# Patient Record
Sex: Female | Born: 1965
Health system: Southern US, Community
[De-identification: ages and names within clinical notes are randomized; demographics above are authoritative.]

## PROBLEM LIST (undated history)

## (undated) DIAGNOSIS — E039 Hypothyroidism, unspecified: Secondary | ICD-10-CM

## (undated) DIAGNOSIS — Z87442 Personal history of urinary calculi: Secondary | ICD-10-CM

## (undated) DIAGNOSIS — J449 Chronic obstructive pulmonary disease, unspecified: Secondary | ICD-10-CM

## (undated) DIAGNOSIS — F419 Anxiety disorder, unspecified: Secondary | ICD-10-CM

## (undated) DIAGNOSIS — M419 Scoliosis, unspecified: Secondary | ICD-10-CM

## (undated) DIAGNOSIS — R7303 Prediabetes: Secondary | ICD-10-CM

---

## 2013-07-09 ENCOUNTER — Ambulatory Visit: Payer: Self-pay

## 2013-07-09 ENCOUNTER — Ambulatory Visit (INDEPENDENT_AMBULATORY_CARE_PROVIDER_SITE_OTHER): Payer: BC Managed Care – PPO | Admitting: Physician Assistant

## 2013-07-09 VITALS — BP 112/74 | HR 73 | Temp 97.9°F | Resp 18 | Ht 67.25 in | Wt 159.0 lb

## 2013-07-09 DIAGNOSIS — R21 Rash and other nonspecific skin eruption: Secondary | ICD-10-CM

## 2013-07-09 DIAGNOSIS — B370 Candidal stomatitis: Secondary | ICD-10-CM

## 2013-07-09 MED ORDER — NYSTATIN 100000 UNIT/ML MT SUSP: 500000.0000 [IU] | Freq: Four times a day (QID) | OROMUCOSAL | Status: DC

## 2013-07-09 NOTE — Progress Notes (Signed)
Patient ID: Brenda Johnston MRN: [id number], DOB: Dec 13, 1966, 47 y.o. Date of Encounter: 07/09/2013, 8:14 PM  Primary Physician: No PCP Per Patient  Chief Complaint: "I have thrush."   HPI: 47 y.o. female with history below presents with a 1 week history of a painful white rash along the surface along her tongue. Also causing some soreness along the right side of her neck. Afebrile. No chills. States, "I have thrush." States that she had thrush one year ago as well. No formal work up done as to etiology. No recent antibiotics. No inhaled steroids. States incident was due to increased stress and so is this one. Never been checked for HIV. Married for 27 years. Husband here with her today in the room.   Stays thirsty. Drinks about a gallon of sweet tea per day. Gets up to urinate twice per night, this is baseline for her.    History reviewed. No pertinent past medical history.   Home Meds: Prior to Admission medications   Not on File    Allergies:  Allergies  Allergen Reactions  . Codeine   . Penicillins     History   Social History  . Marital Status: Married    Spouse Name: N/A    Number of Children: N/A  . Years of Education: N/A   Occupational History  . Not on file.   Social History Main Topics  . Smoking status: Current Every Day Smoker  . Smokeless tobacco: Not on file  . Alcohol Use: No  . Drug Use: No  . Sexually Active: No   Other Topics Concern  . Not on file   Social History Narrative  . No narrative on file     Review of Systems: Constitutional: negative for chills, fever, or fatigue  HEENT: positive for ST. negative for vision changes, hearing loss, congestion, or rhinorrhea Cardiovascular: negative for chest pain or palpitations Respiratory: negative for wheezing, shortness of breath, or cough Dermatological: see above   Physical Exam: Blood pressure 112/74, pulse 73, temperature 97.9 F (36.6 C), temperature source Oral, resp. rate 18, height 5'  7.25" (1.708 m), weight 159 lb (72.122 kg), SpO2 98.00%., Body mass index is 24.72 kg/(m^2). General: Well developed, well nourished, in no acute distress. Head: Normocephalic, atraumatic, eyes without discharge, sclera non-icteric, nares are without discharge. Bilateral auditory canals clear, TM's are without perforation, pearly grey and translucent with reflective cone of light bilaterally. Oral cavity moist, posterior pharynx without exudate, erythema, peritonsillar abscess, or post nasal drip. Surface of the tongue with white lesions on an erythematous base.   Neck: Supple. No thyromegaly. Full ROM. No lymphadenopathy. Lungs: Clear bilaterally to auscultation without wheezes, rales, or rhonchi. Breathing is unlabored. Heart: RRR with S1 S2. No murmurs, rubs, or gallops appreciated. Msk:  Strength and tone normal for age. Extremities/Skin: Warm and dry. No clubbing or cyanosis. No edema. No rashes or suspicious lesions. Neuro: Alert and oriented X 3. Moves all extremities spontaneously. Gait is normal. CNII-XII grossly in tact. Psych:  Responds to questions appropriately with a normal affect.   Labs: Results for orders placed in visit on 07/09/13  POCT SKIN KOH      Result Value Range   Skin KOH, POC Positive    GLUCOSE, POCT (MANUAL RESULT ENTRY)      Result Value Range   POC Glucose 105 (*) 70 - 99 mg/dl     Patient and patient's husband are very clear that they decline HIV testing. I have advised them both  that I recommend this, however they again state they do not want HIV testing.   ASSESSMENT AND PLAN:  47 y.o. female with thrush.  -Nystatin 100,000/mL 1 tsp po qid #240 RF 1 -Declines HIV evaluation, I advised to her the importance of this -RTC prn  Signed, Eula Listen, PA-C 07/09/2013 8:14 PM

## 2013-07-11 ENCOUNTER — Telehealth: Payer: Self-pay

## 2013-07-11 DIAGNOSIS — B370 Candidal stomatitis: Secondary | ICD-10-CM

## 2013-07-11 MED ORDER — FLUCONAZOLE 100 MG PO TABS: ORAL_TABLET | ORAL | Status: DC

## 2013-07-11 NOTE — Telephone Encounter (Signed)
Changed. New medication sent in. Stop the Nystatin.

## 2013-07-11 NOTE — Telephone Encounter (Signed)
Patient would like medicine prescribed Monday switched from a liquid to a pill form. Please call when ready.

## 2013-07-11 NOTE — Telephone Encounter (Signed)
Pt advised.

## 2013-10-19 ENCOUNTER — Other Ambulatory Visit: Payer: Self-pay | Admitting: Physician Assistant

## 2015-12-17 ENCOUNTER — Other Ambulatory Visit: Payer: Self-pay | Admitting: Nurse Practitioner

## 2015-12-17 ENCOUNTER — Ambulatory Visit
Admission: RE | Admit: 2015-12-17 | Discharge: 2015-12-17 | Disposition: A | Discharge status: Home / Self Care | Payer: BLUE CROSS/BLUE SHIELD | Source: Ambulatory Visit | Attending: Nurse Practitioner | Admitting: Nurse Practitioner

## 2015-12-17 DIAGNOSIS — R062 Wheezing: Secondary | ICD-10-CM

## 2015-12-17 DIAGNOSIS — R05 Cough: Secondary | ICD-10-CM

## 2015-12-17 DIAGNOSIS — R059 Cough, unspecified: Secondary | ICD-10-CM

## 2015-12-17 DIAGNOSIS — F17200 Nicotine dependence, unspecified, uncomplicated: Secondary | ICD-10-CM

## 2016-12-31 DIAGNOSIS — J102 Influenza due to other identified influenza virus with gastrointestinal manifestations: Secondary | ICD-10-CM | POA: Diagnosis not present

## 2017-01-10 DIAGNOSIS — J209 Acute bronchitis, unspecified: Secondary | ICD-10-CM | POA: Diagnosis not present

## 2017-03-13 DIAGNOSIS — J069 Acute upper respiratory infection, unspecified: Secondary | ICD-10-CM | POA: Diagnosis not present

## 2017-04-05 DIAGNOSIS — B370 Candidal stomatitis: Secondary | ICD-10-CM | POA: Diagnosis not present

## 2018-03-15 DIAGNOSIS — H01002 Unspecified blepharitis right lower eyelid: Secondary | ICD-10-CM | POA: Diagnosis not present

## 2018-03-15 DIAGNOSIS — H01001 Unspecified blepharitis right upper eyelid: Secondary | ICD-10-CM | POA: Diagnosis not present

## 2018-03-15 DIAGNOSIS — H01004 Unspecified blepharitis left upper eyelid: Secondary | ICD-10-CM | POA: Diagnosis not present

## 2018-03-15 DIAGNOSIS — H04123 Dry eye syndrome of bilateral lacrimal glands: Secondary | ICD-10-CM | POA: Diagnosis not present

## 2018-03-24 ENCOUNTER — Encounter: Payer: Self-pay | Admitting: Family Medicine

## 2018-03-24 ENCOUNTER — Other Ambulatory Visit: Payer: Self-pay

## 2018-03-24 ENCOUNTER — Ambulatory Visit (INDEPENDENT_AMBULATORY_CARE_PROVIDER_SITE_OTHER): Payer: BLUE CROSS/BLUE SHIELD | Admitting: Family Medicine

## 2018-03-24 VITALS — BP 112/84 | HR 96 | Temp 99.0°F | Ht 67.72 in | Wt 183.0 lb

## 2018-03-24 DIAGNOSIS — R631 Polydipsia: Secondary | ICD-10-CM | POA: Diagnosis not present

## 2018-03-24 DIAGNOSIS — R5383 Other fatigue: Secondary | ICD-10-CM | POA: Diagnosis not present

## 2018-03-24 DIAGNOSIS — R3121 Asymptomatic microscopic hematuria: Secondary | ICD-10-CM | POA: Diagnosis not present

## 2018-03-24 DIAGNOSIS — F418 Other specified anxiety disorders: Secondary | ICD-10-CM | POA: Diagnosis not present

## 2018-03-24 LAB — POCT URINALYSIS DIP (MANUAL ENTRY)
Bilirubin, UA: NEGATIVE
Glucose, UA: NEGATIVE mg/dL
Ketones, POC UA: NEGATIVE mg/dL
Leukocytes, UA: NEGATIVE
Nitrite, UA: NEGATIVE
Protein Ur, POC: NEGATIVE mg/dL
Spec Grav, UA: 1.025 (ref 1.010–1.025)
Urobilinogen, UA: 0.2 E.U./dL
pH, UA: 5.5 (ref 5.0–8.0)

## 2018-03-24 MED ORDER — SERTRALINE HCL 50 MG PO TABS: 50.0000 mg | ORAL_TABLET | Freq: Every day | ORAL | 3 refills | Status: DC

## 2018-03-24 NOTE — Progress Notes (Signed)
3/29/20192:13 PM  Brenda Johnston [birth year], 52 y.o. female [id number]  Chief Complaint  Patient presents with  . Establish Care    Wantint to have lab work done. Says she is thirsty all the time, fatigue and has been feeling aanxiety and depressiom    HPI:   Patient is a 52 y.o. female who presents today to establish care and has several concerns  She reports months of not feeling well, hard to describe, but feels that she is getting worse She is very tired, has been gaining weight, thirsty all the time. She reports nausea.  She is worried she is having either thyroid issues or diabetes as they both run in her family She is also struggling with depression and anxiety It started with the death of her mother 2 years ago. 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 She denies SI. She has no previous issues with depression. She is requesting to start medication.  She denies any known chronic medical conditions Patient is has been postmenopausal for about 4 years She has not had a PCP in a very long time  Depression screen PHQ 2/9 03/24/2018  Decreased Interest 3  Down, Depressed, Hopeless 3  PHQ - 2 Score 6  Altered sleeping 3  Tired, decreased energy 3  Change in appetite 2  Feeling bad or failure about yourself  2  Trouble concentrating 3  Moving slowly or fidgety/restless 3  Suicidal thoughts 0  PHQ-9 Score 22   GAD 7 : Generalized Anxiety Score 03/24/2018  Nervous, Anxious, on Edge 3  Control/stop worrying 3  Worry too much - different things 3  Trouble relaxing 3  Restless 3  Easily annoyed or irritable 3  Afraid - awful might happen 3  Total GAD 7 Score 21  Anxiety Difficulty Extremely difficult     Allergies  Allergen Reactions  . Codeine   . Penicillins     Prior to Admission medications   Medication Sig Start Date End Date Taking? Authorizing  Provider    History reviewed. No pertinent past medical history.  History reviewed. No pertinent surgical history.  Social History   Tobacco Use  . Smoking status: Current Every Day Smoker  . Smokeless tobacco: Never Used  Substance Use Topics  . Alcohol use: No    Family History  Problem Relation Age of Onset  . Cancer Mother   . Stroke Father   . Diabetes Sister   . Hypertension Brother     Review of Systems  Constitutional: Positive for chills and malaise/fatigue. Negative for diaphoresis, fever and weight loss.  HENT: Negative for congestion, ear pain and sore throat.   Eyes: Negative for blurred vision and double vision.  Respiratory: Negative for cough and shortness of breath.   Cardiovascular: Positive for palpitations. Negative for chest pain and leg swelling.  Gastrointestinal: Positive for diarrhea and nausea. Negative for abdominal pain, blood in stool, constipation, heartburn, melena and vomiting.  Genitourinary: Positive for frequency. Negative for dysuria, hematuria and urgency.  Musculoskeletal: Negative for myalgias.  Neurological: Positive for dizziness. Negative for sensory change, speech change, focal weakness and headaches.  Endo/Heme/Allergies: Positive for polydipsia.  Psychiatric/Behavioral: Positive for depression. Negative for substance abuse and suicidal ideas. The patient is nervous/anxious. The patient does not have insomnia.      OBJECTIVE:  Blood pressure 112/84, pulse 96, temperature 99 F (37.2 C),  temperature source Oral, height 5' 7.72" (1.72 m), weight 183 lb (83 kg), SpO2 100 %.   Physical Exam  Constitutional: She is oriented to person, place, and time and well-developed, well-nourished, and in no distress.  HENT:  Head: Normocephalic and atraumatic.  Right Ear: Hearing, tympanic membrane, external ear and ear canal normal.  Left Ear: Hearing, tympanic membrane, external ear and ear canal normal.  Mouth/Throat: Oropharynx is  clear and moist.  Eyes: Pupils are equal, round, and reactive to light. EOM are normal.  Neck: Neck supple. No thyromegaly present.  Cardiovascular: Normal rate, regular rhythm, normal heart sounds and intact distal pulses. Exam reveals no gallop and no friction rub.  No murmur heard. Pulmonary/Chest: Effort normal and breath sounds normal. She has no wheezes. She has no rales.  Abdominal: Soft. Bowel sounds are normal. She exhibits no distension and no mass. There is no tenderness.  Musculoskeletal: Normal range of motion. She exhibits no edema.  Lymphadenopathy:    She has no cervical adenopathy.  Neurological: She is alert and oriented to person, place, and time. She has normal reflexes. Gait normal.  Skin: Skin is warm and dry.  Psychiatric: Her affect is labile. She exhibits a depressed mood.  Nursing note and vitals reviewed.   Results for orders placed or performed in visit on 03/24/18 (from the past 24 hour(s))  POCT urinalysis dipstick     Status: Abnormal   Collection Time: 03/24/18  2:24 PM  Result Value Ref Range   Color, UA yellow yellow   Clarity, UA clear clear   Glucose, UA negative negative mg/dL   Bilirubin, UA negative negative   Ketones, POC UA negative negative mg/dL   Spec Grav, UA 1.6101.025 9.6041.010 - 1.025   Blood, UA moderate (A) negative   pH, UA 5.5 5.0 - 8.0   Protein Ur, POC negative negative mg/dL   Urobilinogen, UA 0.2 0.2 or 1.0 E.U./dL   Nitrite, UA Negative Negative   Leukocytes, UA Negative Negative    ASSESSMENT and PLAN  1. Anxiety with depression Patient with severe depression and anxiety that seems to have been triggered by grief. Discussed treatment options, discussed new med r/se/b. Discussed titration. Provided resources for grief counseling. RTC precautions given.  2. Fatigue, unspecified type Might be related to depression and anxiety, however will rule out organic etiologies.  - CBC with Differential/Platelet - TSH - Comprehensive  metabolic panel - POCT urinalysis dipstick - Hemoglobin A1c  3. Polydipsia - Comprehensive metabolic panel - POCT urinalysis dipstick - Hemoglobin A1c  4. Asymptomatic microscopic hematuria - Urine Microscopic  Other orders - sertraline (ZOLOFT) 50 MG tablet; Take 1 tablet (50 mg total) by mouth daily.  Return in about 3 weeks (around 04/14/2018).    Myles LippsIrma M Santiago, MD Primary [facility] at [facility] [address] [place], [place] [address] Ph.  [phone] Fax [phone]

## 2018-03-24 NOTE — Patient Instructions (Addendum)
1. Start sertraline (zolft) with 1/2 tablet once a day for a week. Then increase to 1 tablet daily. Morning is preferred. However if it makes you sleepy it is okay to take at bedtime.  2. I encourage you to consider grief counseling. Here are some resources:   [facility], [phone]  [facility], [phone]  [facility], [phone]    IF you received an x-ray today, you will receive an invoice from [facility]. Please contact [facility] at [phone] with questions or concerns regarding your invoice.   IF you received labwork today, you will receive an invoice from [facility]. Please contact [facility] at [phone] with questions or concerns regarding your invoice.   Our billing staff will not be able to assist you with questions regarding bills from these companies.  You will be contacted with the lab results as soon as they are available. The fastest way to get your results is to activate your My Chart account. Instructions are located on the last page of this paperwork. If you have not heard from us regarding the results in 2 weeks, please contact this office.

## 2018-03-25 LAB — URINALYSIS, MICROSCOPIC ONLY
Bacteria, UA: NONE SEEN
Casts: NONE SEEN /lpf

## 2018-03-25 LAB — CBC WITH DIFFERENTIAL/PLATELET
Basophils Absolute: 0.1 10*3/uL (ref 0.0–0.2)
Basos: 1 %
EOS (ABSOLUTE): 0.2 10*3/uL (ref 0.0–0.4)
Eos: 2 %
Hematocrit: 40.9 % (ref 34.0–46.6)
Hemoglobin: 14 g/dL (ref 11.1–15.9)
Immature Grans (Abs): 0 10*3/uL (ref 0.0–0.1)
Immature Granulocytes: 0 %
Lymphocytes Absolute: 2.9 10*3/uL (ref 0.7–3.1)
Lymphs: 34 %
MCH: 31.8 pg (ref 26.6–33.0)
MCHC: 34.2 g/dL (ref 31.5–35.7)
MCV: 93 fL (ref 79–97)
Monocytes Absolute: 0.3 10*3/uL (ref 0.1–0.9)
Monocytes: 4 %
Neutrophils Absolute: 5.1 10*3/uL (ref 1.4–7.0)
Neutrophils: 59 %
Platelets: 265 10*3/uL (ref 150–379)
RBC: 4.4 x10E6/uL (ref 3.77–5.28)
RDW: 15.4 % (ref 12.3–15.4)
WBC: 8.5 10*3/uL (ref 3.4–10.8)

## 2018-03-25 LAB — COMPREHENSIVE METABOLIC PANEL
ALT: 48 IU/L — ABNORMAL HIGH (ref 0–32)
AST: 33 IU/L (ref 0–40)
Albumin/Globulin Ratio: 1.8 (ref 1.2–2.2)
Albumin: 4.9 g/dL (ref 3.5–5.5)
Alkaline Phosphatase: 82 IU/L (ref 39–117)
BUN/Creatinine Ratio: 10 (ref 9–23)
BUN: 13 mg/dL (ref 6–24)
Bilirubin Total: 0.5 mg/dL (ref 0.0–1.2)
CO2: 22 mmol/L (ref 20–29)
Calcium: 10.1 mg/dL (ref 8.7–10.2)
Chloride: 100 mmol/L (ref 96–106)
Creatinine, Ser: 1.28 mg/dL — ABNORMAL HIGH (ref 0.57–1.00)
GFR calc Af Amer: 56 mL/min/{1.73_m2} — ABNORMAL LOW (ref >59)
GFR calc non Af Amer: 49 mL/min/{1.73_m2} — ABNORMAL LOW (ref >59)
Globulin, Total: 2.8 g/dL (ref 1.5–4.5)
Glucose: 147 mg/dL — ABNORMAL HIGH (ref 65–99)
Potassium: 3.7 mmol/L (ref 3.5–5.2)
Sodium: 141 mmol/L (ref 134–144)
Total Protein: 7.7 g/dL (ref 6.0–8.5)

## 2018-03-25 LAB — TSH: TSH: 94.86 u[IU]/mL — ABNORMAL HIGH (ref 0.450–4.500)

## 2018-03-25 LAB — HEMOGLOBIN A1C
Est. average glucose Bld gHb Est-mCnc: 120 mg/dL
Hgb A1c MFr Bld: 5.8 % — ABNORMAL HIGH (ref 4.8–5.6)

## 2018-03-28 ENCOUNTER — Other Ambulatory Visit: Payer: Self-pay | Admitting: Family Medicine

## 2018-03-28 MED ORDER — LEVOTHYROXINE SODIUM 50 MCG PO TABS: 50.0000 ug | ORAL_TABLET | Freq: Every day | ORAL | 3 refills | Status: DC

## 2018-03-29 ENCOUNTER — Telehealth: Payer: Self-pay | Admitting: *Deleted

## 2018-03-29 NOTE — Telephone Encounter (Signed)
Patient states that she stopped taking Zoloft it gave her sweats,headache stomach pain.  She wanted to know if there is something she can take as needed rather than everyday

## 2018-04-05 NOTE — Telephone Encounter (Signed)
Lets hold off on any other depression medication as her symptoms might be related to her hypothyroidism. thanks

## 2018-04-06 NOTE — Telephone Encounter (Signed)
Left detailed message per HIPPA

## 2018-04-14 ENCOUNTER — Other Ambulatory Visit: Payer: Self-pay

## 2018-04-14 ENCOUNTER — Ambulatory Visit (INDEPENDENT_AMBULATORY_CARE_PROVIDER_SITE_OTHER): Payer: BLUE CROSS/BLUE SHIELD | Admitting: Family Medicine

## 2018-04-14 ENCOUNTER — Encounter: Payer: Self-pay | Admitting: Family Medicine

## 2018-04-14 VITALS — BP 122/64 | HR 100 | Temp 98.1°F | Ht 67.5 in | Wt 180.6 lb

## 2018-04-14 DIAGNOSIS — R7303 Prediabetes: Secondary | ICD-10-CM | POA: Diagnosis not present

## 2018-04-14 DIAGNOSIS — E039 Hypothyroidism, unspecified: Secondary | ICD-10-CM

## 2018-04-14 DIAGNOSIS — R3121 Asymptomatic microscopic hematuria: Secondary | ICD-10-CM | POA: Diagnosis not present

## 2018-04-14 DIAGNOSIS — N289 Disorder of kidney and ureter, unspecified: Secondary | ICD-10-CM | POA: Diagnosis not present

## 2018-04-14 DIAGNOSIS — F418 Other specified anxiety disorders: Secondary | ICD-10-CM | POA: Diagnosis not present

## 2018-04-14 MED ORDER — CITALOPRAM HYDROBROMIDE 10 MG PO TABS: 10.0000 mg | ORAL_TABLET | Freq: Every day | ORAL | 1 refills | Status: DC

## 2018-04-14 NOTE — Progress Notes (Signed)
4/19/20191:40 PM  Brenda Johnston Oct 29, 1966, 52 y.o. female [id number]  Chief Complaint  Patient presents with  . Follow-up    stopped taking the meds as instructed, still having anxiety with panic attacks. Having feet cramps as well  . Dry Eye    right eye has felt dry for 3 wks, went to eye doctor, he says it may be due to thyroid condition. Problem only occur at night    HPI:   Patient is a 52 y.o. female with past medical history significant for anxiety and depression who presents today for follow-up  At last visit she was started on sertraline Did not tolerate at all, was having worse anxiety, irritability She was found to have hypothyroidism, with TSH > 90 Started on levothyroxine 50mcg, States she started sertraline prior to levothyroxine States she is tolerating levothyroxine well, has felt improved energy, no changes in mood.  She has never tried anything else for her anxiety or depression She is also having nocturnal leg cramps and is wondering about tonic water She does not drink enough water during the day She was also found to be pre-diabetic with hgb a1c 5.8  Depression screen [facility] 2/9 04/14/2018 03/24/2018  Decreased Interest 0 3  Down, Depressed, Hopeless 0 3  PHQ - 2 Score 0 6  Altered sleeping - 3  Tired, decreased energy - 3  Change in appetite - 2  Feeling bad or failure about yourself  - 2  Trouble concentrating - 3  Moving slowly or fidgety/restless - 3  Suicidal thoughts - 0  PHQ-9 Score - 22    Allergies  Allergen Reactions  . Codeine   . Penicillins     Prior to Admission medications   Medication Sig Start Date End Date Taking? Authorizing Provider  levothyroxine (SYNTHROID, LEVOTHROID) 50 MCG tablet Take 1 tablet (50 mcg total) by mouth daily. 03/28/18  Yes Myles LippsSantiago, Kaytie Ratcliffe M, MD  sertraline (ZOLOFT) 50 MG tablet Take 1 tablet (50 mg total) by mouth daily. Patient not taking: Reported on 04/14/2018 03/24/18   Myles LippsSantiago, Amye Grego M, MD    History  reviewed. No pertinent past medical history.  History reviewed. No pertinent surgical history.  Social History   Tobacco Use  . Smoking status: Current Every Day Smoker  . Smokeless tobacco: Never Used  Substance Use Topics  . Alcohol use: No    Family History  Problem Relation Age of Onset  . Cancer Mother   . Stroke Father   . Diabetes Sister   . Hypertension Brother     ROS Per hpi  OBJECTIVE:  Blood pressure 122/64, pulse 100, temperature 98.1 F (36.7 C), temperature source Oral, height 5' 7.5" (1.715 m), weight 180 lb 9.6 oz (81.9 kg), SpO2 97 %.  Physical Exam  Constitutional: She is oriented to person, place, and time. She appears well-developed and well-nourished.  HENT:  Head: Normocephalic and atraumatic.  Mouth/Throat: Mucous membranes are normal.  Eyes: Pupils are equal, round, and reactive to light. EOM are normal. No scleral icterus.  Neck: Neck supple.  Pulmonary/Chest: Effort normal.  Neurological: She is alert and oriented to person, place, and time. Gait normal.  Skin: Skin is warm and dry.  Psychiatric: She has a normal mood and affect.  Nursing note and vitals reviewed.   ASSESSMENT and PLAN  1. Anxiety with depression Discussed treatment options, will do another trial of SSRI. Start at low dose and titrate. Consider SNRI if intolerant of celexa.   2. Hypothyroidism, unspecified  type Cont with levo , due for recheck tsh in mid may  3. Pre-diabetes Anticipate related to weight gain 2/2 undiagnosed hypothyroidism. Discussed healthy eating, avoidance of simple sugars, regular exercise (which will also help with mood). Recheck a1c in 6 months (around oct)   4. Decreased renal function Discussed pushing fluids, as this will also help with leg cramps.  - Comprehensive metabolic panel  5. Asymptomatic microscopic hematuria - Urine Culture - Urine Microscopic  Other orders - citalopram (CELEXA) 10 MG tablet; Take 1 tablet (10 mg total)  by mouth daily.  Return in about 1 month (around 05/12/2018).    Myles Lipps, MD Primary Care at [facility] [address] [place], [place] [address] Ph.  [phone] Fax [phone]

## 2018-04-14 NOTE — Patient Instructions (Addendum)
   IF you received an x-ray today, you will receive an invoice from [facility]. Please contact [facility] at [phone] with questions or concerns regarding your invoice.   IF you received labwork today, you will receive an invoice from [facility]. Please contact [facility] at [phone] with questions or concerns regarding your invoice.   Our billing staff will not be able to assist you with questions regarding bills from these companies.  You will be contacted with the lab results as soon as they are available. The fastest way to get your results is to activate your My Chart account. Instructions are located on the last page of this paperwork. If you have not heard from us regarding the results in 2 weeks, please contact this office.    Prediabetes Eating Plan Prediabetes-also called impaired glucose tolerance or impaired fasting glucose-is a condition that causes blood sugar (blood glucose) levels to be higher than normal. Following a healthy diet can help to keep prediabetes under control. It can also help to lower the risk of type 2 diabetes and heart disease, which are increased in people who have prediabetes. Along with regular exercise, a healthy diet:  Promotes weight loss.  Helps to control blood sugar levels.  Helps to improve the way that the body uses insulin.  What do I need to know about this eating plan?  Use the glycemic index (GI) to plan your meals. The index tells you how quickly a food will raise your blood sugar. Choose low-GI foods. These foods take a longer time to raise blood sugar.  Pay close attention to the amount of carbohydrates in the food that you eat. Carbohydrates increase blood sugar levels.  Keep track of how many calories you take in. Eating the right amount of calories will help you to achieve a healthy weight. Losing about 7 percent of your starting weight can help to prevent type 2 diabetes.  You may want to follow a  Mediterranean diet. This diet includes a lot of vegetables, lean meats or fish, whole grains, fruits, and healthy oils and fats. What foods can I eat? Grains Whole grains, such as whole-wheat or whole-grain breads, crackers, cereals, and pasta. Unsweetened oatmeal. Bulgur. Barley. Quinoa. Brown rice. Corn or whole-wheat flour tortillas or taco shells. Vegetables Lettuce. Spinach. Peas. Beets. Cauliflower. Cabbage. Broccoli. Carrots. Tomatoes. Squash. Eggplant. Herbs. Peppers. Onions. Cucumbers. Brussels sprouts. Fruits Berries. Bananas. Apples. Oranges. Grapes. Papaya. Mango. Pomegranate. Kiwi. Grapefruit. Cherries. Meats and Other Protein Sources Seafood. Lean meats, such as chicken and turkey or lean cuts of pork and beef. Tofu. Eggs. Nuts. Beans. Dairy Low-fat or fat-free dairy products, such as yogurt, cottage cheese, and cheese. Beverages Water. Tea. Coffee. Sugar-free or diet soda. Seltzer water. Milk. Milk alternatives, such as soy or almond milk. Condiments Mustard. Relish. Low-fat, low-sugar ketchup. Low-fat, low-sugar barbecue sauce. Low-fat or fat-free mayonnaise. Sweets and Desserts Sugar-free or low-fat pudding. Sugar-free or low-fat ice cream and other frozen treats. Fats and Oils Avocado. Walnuts. Olive oil. The items listed above may not be a complete list of recommended foods or beverages. Contact your dietitian for more options. What foods are not recommended? Grains Refined white flour and flour products, such as bread, pasta, snack foods, and cereals. Beverages Sweetened drinks, such as sweet iced tea and soda. Sweets and Desserts Baked goods, such as cake, cupcakes, pastries, cookies, and cheesecake. The items listed above may not be a complete list of foods and beverages to avoid. Contact your dietitian for more information.   This information is not intended to replace advice given to you by your health care provider. Make sure you discuss any questions you have with  your health care provider. Document Released: 04/29/2015 Document Revised: 05/20/2016 Document Reviewed: 01/08/2015 Elsevier Interactive Patient Education  2017 Elsevier Inc.  

## 2018-04-15 LAB — URINALYSIS, MICROSCOPIC ONLY: Casts: NONE SEEN /lpf

## 2018-04-15 LAB — URINE CULTURE: Organism ID, Bacteria: NO GROWTH

## 2018-04-15 LAB — COMPREHENSIVE METABOLIC PANEL
ALT: 20 IU/L (ref 0–32)
AST: 19 IU/L (ref 0–40)
Albumin/Globulin Ratio: 1.9 (ref 1.2–2.2)
Albumin: 4.7 g/dL (ref 3.5–5.5)
Alkaline Phosphatase: 76 IU/L (ref 39–117)
BUN/Creatinine Ratio: 19 (ref 9–23)
BUN: 18 mg/dL (ref 6–24)
Bilirubin Total: 0.5 mg/dL (ref 0.0–1.2)
CO2: 25 mmol/L (ref 20–29)
Calcium: 9.3 mg/dL (ref 8.7–10.2)
Chloride: 103 mmol/L (ref 96–106)
Creatinine, Ser: 0.95 mg/dL (ref 0.57–1.00)
GFR calc Af Amer: 80 mL/min/{1.73_m2} (ref >59)
GFR calc non Af Amer: 70 mL/min/{1.73_m2} (ref >59)
Globulin, Total: 2.5 g/dL (ref 1.5–4.5)
Glucose: 100 mg/dL — ABNORMAL HIGH (ref 65–99)
Potassium: 3.6 mmol/L (ref 3.5–5.2)
Sodium: 141 mmol/L (ref 134–144)
Total Protein: 7.2 g/dL (ref 6.0–8.5)

## 2018-04-17 ENCOUNTER — Encounter: Payer: Self-pay | Admitting: Family Medicine

## 2018-05-12 ENCOUNTER — Ambulatory Visit: Payer: BLUE CROSS/BLUE SHIELD | Admitting: Family Medicine

## 2018-05-12 ENCOUNTER — Other Ambulatory Visit: Payer: Self-pay

## 2018-05-12 ENCOUNTER — Encounter: Payer: Self-pay | Admitting: Family Medicine

## 2018-05-12 VITALS — BP 120/64 | HR 93 | Temp 98.8°F | Ht 68.0 in | Wt 172.8 lb

## 2018-05-12 DIAGNOSIS — F418 Other specified anxiety disorders: Secondary | ICD-10-CM

## 2018-05-12 DIAGNOSIS — E039 Hypothyroidism, unspecified: Secondary | ICD-10-CM

## 2018-05-12 MED ORDER — ESCITALOPRAM OXALATE 5 MG PO TABS: 5.0000 mg | ORAL_TABLET | Freq: Every day | ORAL | 0 refills | Status: DC

## 2018-05-12 NOTE — Patient Instructions (Signed)
     IF you received an x-ray today, you will receive an invoice from [facility]. Please contact [facility] at [phone] with questions or concerns regarding your invoice.   IF you received labwork today, you will receive an invoice from [facility]. Please contact [facility] at [phone] with questions or concerns regarding your invoice.   Our billing staff will not be able to assist you with questions regarding bills from these companies.  You will be contacted with the lab results as soon as they are available. The fastest way to get your results is to activate your My Chart account. Instructions are located on the last page of this paperwork. If you have not heard from us regarding the results in 2 weeks, please contact this office.     

## 2018-05-12 NOTE — Progress Notes (Signed)
5/17/20199:42 AM  Brenda Johnston 02-14-1966, 52 y.o. female [id number]  Chief Complaint  Patient presents with  . Medication Problem    taking the Celexa for 2 days, causes nausea. Wants a change to a new anxiety meds.    HPI:   Patient is a 52 y.o. female with past medical history significant for hypothyroidism who presents today for followup  Tolerating levo well, starting to feel better, unable to be more concrete than that She however did not tolerate celexa, made her very nauseous. She did several trials, all with same return of nausea sertaline made her too anxious She would like to try lexapro, her mother is on it and does really well No acute issues today  Fall Risk  05/12/2018 04/14/2018  Falls in the past year? No No     Depression screen [facility] 2/9 05/12/2018 04/14/2018 03/24/2018  Decreased Interest 1 0 3  Down, Depressed, Hopeless 1 0 3  PHQ - 2 Score 2 0 6  Altered sleeping 1 - 3  Tired, decreased energy 1 - 3  Change in appetite 0 - 2  Feeling bad or failure about yourself  1 - 2  Trouble concentrating 0 - 3  Moving slowly or fidgety/restless 1 - 3  Suicidal thoughts 0 - 0  PHQ-9 Score 6 - 22  Difficult doing work/chores Somewhat difficult - -   GAD 7 : Generalized Anxiety Score 05/12/2018 03/24/2018  Nervous, Anxious, on Edge 1 3  Control/stop worrying 3 3  Worry too much - different things 3 3  Trouble relaxing 2 3  Restless 2 3  Easily annoyed or irritable 1 3  Afraid - awful might happen 1 3  Total GAD 7 Score 13 21  Anxiety Difficulty Somewhat difficult Extremely difficult     Allergies  Allergen Reactions  . Codeine   . Penicillins     Prior to Admission medications   Medication Sig Start Date End Date Taking? Authorizing Provider  citalopram (CELEXA) 10 MG tablet Take 1 tablet (10 mg total) by mouth daily. Patient not taking: Reported on 05/12/2018 04/14/18   Myles Lipps, MD  levothyroxine (SYNTHROID, LEVOTHROID) 50 MCG tablet Take 1  tablet (50 mcg total) by mouth daily. 03/28/18   Myles Lipps, MD    History reviewed. No pertinent past medical history.  History reviewed. No pertinent surgical history.  Social History   Tobacco Use  . Smoking status: Current Every Day Smoker  . Smokeless tobacco: Never Used  Substance Use Topics  . Alcohol use: No    Family History  Problem Relation Age of Onset  . Cancer Mother   . Stroke Father   . Diabetes Sister   . Hypertension Brother     ROS Per hpi  OBJECTIVE:  Blood pressure 120/64, pulse 93, temperature 98.8 F (37.1 C), temperature source Oral, height  (1.727 m), weight 172 lb 12.8 oz (78.4 kg), SpO2 97 %.  Physical Exam  Constitutional: She is oriented to person, place, and time. She appears well-developed and well-nourished.  HENT:  Head: Normocephalic and atraumatic.  Mouth/Throat: Mucous membranes are normal.  Eyes: Pupils are equal, round, and reactive to light. EOM are normal. No scleral icterus.  Neck: Neck supple.  Pulmonary/Chest: Effort normal.  Neurological: She is alert and oriented to person, place, and time.  Skin: Skin is warm and dry.  Psychiatric: She has a normal mood and affect.  Nursing note and vitals reviewed.   ASSESSMENT and PLAN  1. Hypothyroidism, unspecified type - TSH Dose will be adjusted as needed  2. Anxiety with depression - escitalopram (LEXAPRO) 5 MG tablet; Take 1 tablet (5 mg total) by mouth daily. Symptoms improving which most likely have to do with treatment of hypothyroidism. Will do trial of lexapro, consider SNRI if intolerant.  Return in about 1 month (around 06/09/2018).    Myles Lipps, MD Primary Care at [facility] [address] [place], [place] [address] Ph.  [phone] Fax [phone]

## 2018-05-13 LAB — TSH: TSH: 27.57 u[IU]/mL — ABNORMAL HIGH (ref 0.450–4.500)

## 2018-05-15 MED ORDER — LEVOTHYROXINE SODIUM 75 MCG PO TABS: 75.0000 ug | ORAL_TABLET | Freq: Every day | ORAL | 3 refills | Status: DC

## 2018-05-15 NOTE — Addendum Note (Signed)
Addended by: Myles Lipps on: 05/15/2018 05:09 PM   Modules accepted: Orders

## 2018-06-13 ENCOUNTER — Ambulatory Visit: Payer: BLUE CROSS/BLUE SHIELD | Admitting: Family Medicine

## 2018-07-03 ENCOUNTER — Ambulatory Visit (INDEPENDENT_AMBULATORY_CARE_PROVIDER_SITE_OTHER): Payer: BLUE CROSS/BLUE SHIELD | Admitting: Family Medicine

## 2018-07-03 DIAGNOSIS — E039 Hypothyroidism, unspecified: Secondary | ICD-10-CM | POA: Diagnosis not present

## 2018-07-03 NOTE — Progress Notes (Signed)
Nurse only visit

## 2018-07-04 LAB — TSH: TSH: 16.08 u[IU]/mL — ABNORMAL HIGH (ref 0.450–4.500)

## 2018-07-05 ENCOUNTER — Telehealth: Payer: Self-pay | Admitting: Family Medicine

## 2018-07-05 ENCOUNTER — Other Ambulatory Visit: Payer: Self-pay | Admitting: Family Medicine

## 2018-07-05 DIAGNOSIS — E039 Hypothyroidism, unspecified: Secondary | ICD-10-CM

## 2018-07-05 MED ORDER — LEVOTHYROXINE SODIUM 100 MCG PO TABS: 100.0000 ug | ORAL_TABLET | Freq: Every day | ORAL | 3 refills | Status: DC

## 2018-07-05 NOTE — Telephone Encounter (Signed)
Copied from CRM [phone]. Topic: Quick Communication - See Telephone Encounter >> Jul 05, 2018  3:07 PM Maia Pettiesrtiz, Kristie S wrote: CRM for notification. See Telephone encounter for: 07/05/18. Pt calling for lab results. Lab notes state left detailed msg and CRM in. Pt states she did not have a message on her phone. No CRM in indicating that [facility] can release. Per Steward DroneBrenda need to send CRM. Please call pt back on [phone].

## 2018-07-06 ENCOUNTER — Other Ambulatory Visit: Payer: Self-pay | Admitting: Family Medicine

## 2018-07-06 NOTE — Telephone Encounter (Signed)
LOV: 05/12/18

## 2018-08-21 ENCOUNTER — Ambulatory Visit (INDEPENDENT_AMBULATORY_CARE_PROVIDER_SITE_OTHER): Payer: BLUE CROSS/BLUE SHIELD | Admitting: Family Medicine

## 2018-08-21 DIAGNOSIS — E039 Hypothyroidism, unspecified: Secondary | ICD-10-CM | POA: Diagnosis not present

## 2018-08-22 LAB — TSH: TSH: 8.62 u[IU]/mL — ABNORMAL HIGH (ref 0.450–4.500)

## 2018-08-22 MED ORDER — LEVOTHYROXINE SODIUM 125 MCG PO TABS: 125.0000 ug | ORAL_TABLET | Freq: Every day | ORAL | 3 refills | Status: DC

## 2018-08-23 ENCOUNTER — Encounter: Payer: Self-pay | Admitting: Family Medicine

## 2018-08-24 ENCOUNTER — Telehealth: Payer: Self-pay | Admitting: Family Medicine

## 2018-08-24 NOTE — Telephone Encounter (Signed)
Copied from CRM [id number]. Topic: General - Other >> Aug 24, 2018  2:50 PM Brenda Johnston, [name] J wrote: Reason for CRM: Patient called to get her blood work results.  Patient said it was drawn on Monday and she still has not heard back from the nurse or doctor.  Please advise.  CB#[phone].

## 2018-08-25 ENCOUNTER — Encounter: Payer: Self-pay | Admitting: Family Medicine

## 2018-08-31 NOTE — Telephone Encounter (Signed)
Left message on voicemail to return office call. Dgaddy, CMA 

## 2018-08-31 NOTE — Telephone Encounter (Signed)
Spoke with pt advised per santiago that her thyroid function slowly continues to improve I need to increase her thyroid medication again, sent [facility] to [facility] on [address]. Recheck TSH in 6-8 weeks, lab ordered.  Per pt she will call and schedule appt with santiago and for labs upon returning from beach trip in a couple weeks. Dgaddy, CMA

## 2018-09-05 ENCOUNTER — Telehealth: Payer: Self-pay | Admitting: Family Medicine

## 2018-09-05 NOTE — Telephone Encounter (Signed)
Copied from CRM [id number]. Topic: Quick Communication - See Telephone Encounter >> Sep 05, 2018  8:35 AM Debroah Loop wrote: CRM for notification. See Telephone encounter for: 09/05/18. Patients feels that levothyroxine (SYNTHROID, LEVOTHROID) 125 MCG tablet is making her shaky, itchy, and nervous. Would like a call back to discuss.

## 2018-09-08 ENCOUNTER — Encounter: Payer: Self-pay | Admitting: Family Medicine

## 2018-09-08 ENCOUNTER — Other Ambulatory Visit: Payer: Self-pay

## 2018-09-08 ENCOUNTER — Ambulatory Visit: Payer: BLUE CROSS/BLUE SHIELD | Admitting: Family Medicine

## 2018-09-08 VITALS — BP 109/73 | HR 88 | Temp 97.9°F | Ht 68.0 in | Wt 169.8 lb

## 2018-09-08 DIAGNOSIS — Z1321 Encounter for screening for nutritional disorder: Secondary | ICD-10-CM

## 2018-09-08 DIAGNOSIS — Z1329 Encounter for screening for other suspected endocrine disorder: Secondary | ICD-10-CM | POA: Diagnosis not present

## 2018-09-08 DIAGNOSIS — R0683 Snoring: Secondary | ICD-10-CM | POA: Diagnosis not present

## 2018-09-08 DIAGNOSIS — R5383 Other fatigue: Secondary | ICD-10-CM

## 2018-09-08 DIAGNOSIS — F411 Generalized anxiety disorder: Secondary | ICD-10-CM | POA: Diagnosis not present

## 2018-09-08 DIAGNOSIS — R400 Somnolence: Secondary | ICD-10-CM

## 2018-09-08 DIAGNOSIS — E039 Hypothyroidism, unspecified: Secondary | ICD-10-CM | POA: Diagnosis not present

## 2018-09-08 DIAGNOSIS — Z130 Encounter for screening for diseases of the blood and blood-forming organs and certain disorders involving the immune mechanism: Secondary | ICD-10-CM

## 2018-09-08 DIAGNOSIS — Z13228 Encounter for screening for other metabolic disorders: Secondary | ICD-10-CM

## 2018-09-08 MED ORDER — LEVOTHYROXINE SODIUM 112 MCG PO TABS: 112.0000 ug | ORAL_TABLET | Freq: Every day | ORAL | 1 refills | Status: DC

## 2018-09-08 NOTE — Patient Instructions (Addendum)
Although some of your symptoms may be due to thyroid medication, I will check some other electrolytes and labs as we discussed.  For now okay to try 112 mcg Synthroid once per day.  I will check free T4 and T3 to look at actual circulating thyroid hormone.  Additionally with the snoring and daytime fatigue, I will refer you to sleep specialist as a sleep study may be helpful.   Finally I would like you to follow-up in the next 3 to 4 weeks with Dr. Leretha PolSantiago to discuss the symptoms further, including anxiety as that could be related to thyroid or may need to look at other treatment options as we discussed.  Thank you for coming in today.Return to the clinic or go to the nearest emergency room if any of your symptoms worsen or new symptoms occur.   If you have lab work done today you will be contacted with your lab results within the next 2 weeks.  If you have not heard from us then please contact us. The fastest way to get your results is to register for My Chart.   IF you received an x-ray today, you will receive an invoice from [facility]. Please contact [facility] at [phone] with questions or concerns regarding your invoice.   IF you received labwork today, you will receive an invoice from [facility]. Please contact [facility] at [phone] with questions or concerns regarding your invoice.   Our billing staff will not be able to assist you with questions regarding bills from these companies.  You will be contacted with the lab results as soon as they are available. The fastest way to get your results is to activate your My Chart account. Instructions are located on the last page of this paperwork. If you have not heard from us regarding the results in 2 weeks, please contact this office.

## 2018-09-08 NOTE — Progress Notes (Signed)
Subjective:  By signing my name below, I, Stann Ore, attest that this documentation has been prepared under the direction and in the presence of Meredith Staggers, MD. Electronically Signed: Stann Ore, Scribe. 09/08/2018 , 8:35 AM .  Patient was seen in Room 3 .   Patient ID: Brenda Johnston, female    DOB: Jun 13, 1966, 52 y.o.   MRN: [id number] Chief Complaint  Patient presents with  . Hypothyroidism    having shakey and itching and fatigue. The medication she is taing seems to be not working.    HPI Brenda Johnston is a 52 y.o. female  Patient is here for feeling shaky, itchy and fatigue. She has a history of hypothyroidism. She was seen by Dr. Leretha Pol in May. She did not tolerate Celexa and Zoloft made her too anxious. For treatment of anxiety, she was started Lexapro 5 mg with option of SNRI if not tolerant.   Lab Results  Component Value Date   TSH 8.620 (H) 08/21/2018   TSH 16.080 (H) 07/03/2018   TSH 27.570 (H) 05/12/2018    Patient states she's been feeling really tired; an example is where she could be riding down the street feeling exhausted, and can't keep her eyes opened. She was having thyroid checked every 6-8 weeks with upping her synthroid to 125 mcg. But when changed to the higher dose, she felt heart palpitations with shakiness, nervousness and on edge; so she stopped taking the 125 mcg dose after 2 days. She changed it back down to synthroid 100 mcg since last Monday (Sept 2nd). After switching back to down to synthroid 100 mcg, she's still feeling exhausted as well as nausea. She mentions she's been feeling really tired with these symptoms for months now. She also noted having dry, itchy skin for a few months as well. She denies any tactile hallucinations.   Patient states when she was previously on synthroid 100 mcg before changing to 125 mcg, her energy would be up and down. Her husband mentions patient snores every night and does stir in her sleep. Patient notes having a  deep sleep though, and only waking up about twice a night for nocturia. She's felt nauseated but denies vomiting or fever. She's been able to eat and drink normally. She's been feeling too tired and exhausted for any activity.   She feels depressed and anxiousness because she's more concerned of finding out what's wrong. She denies SI, self injury, or HI. She has tried taking Lexapro because of how it made her feel. She reports she's talked to Dr. Leretha Pol about it. Although, she states her depression and anxiety has somewhat improved with synthroid. She denies meeting with a therapist. She denies any new stressors or changes in life.   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   There are no active problems to display for this patient.  History reviewed. No pertinent past medical history. History reviewed. No pertinent surgical history. Allergies  Allergen Reactions  . Codeine   . Penicillins    Prior to Admission medications   Medication Sig Start Date End Date Taking? Authorizing Provider  escitalopram (LEXAPRO) 5 MG tablet TAKE 1 TABLET BY MOUTH EVERY DAY 07/09/18   Myles Lipps, MD  levothyroxine (SYNTHROID, LEVOTHROID) 125 MCG tablet Take 1 tablet (125 mcg total) by mouth daily. 08/22/18   Myles Lipps, MD   Social History   Socioeconomic History  . Marital status: Married    Spouse name:  Not on file  . Number of children: Not on file  . Years of education: Not on file  . Highest education level: Not on file  Occupational History  . Not on file  Social Needs  . Financial resource strain: Not on file  . Food insecurity:    Worry: Not on file    Inability: Not on file  . Transportation needs:    Medical: Not on file    Non-medical: Not on file  Tobacco Use  . Smoking status: Current Every Day Smoker  . Smokeless tobacco: Never Used  Substance and Sexual Activity  . Alcohol use: No  . Drug use: No  . Sexual activity: Yes    Lifestyle  . Physical activity:    Days per week: Not on file    Minutes per session: Not on file  . Stress: Not on file  Relationships  . Social connections:    Talks on phone: Not on file    Gets together: Not on file    Attends religious service: Not on file    Active member of club or organization: Not on file    Attends meetings of clubs or organizations: Not on file    Relationship status: Not on file  . Intimate partner violence:    Fear of current or ex partner: Not on file    Emotionally abused: Not on file    Physically abused: Not on file    Forced sexual activity: Not on file  Other Topics Concern  . Not on file  Social History Narrative  . Not on file   Review of Systems  Constitutional: Positive for fatigue. Negative for chills, fever and unexpected weight change.  Respiratory: Negative for cough.   Gastrointestinal: Positive for nausea. Negative for constipation, diarrhea and vomiting.  Skin: Negative for rash and wound.  Neurological: Negative for dizziness, weakness and headaches.  Psychiatric/Behavioral: Positive for sleep disturbance. Negative for self-injury and suicidal ideas. The patient is nervous/anxious.        Objective:   Physical Exam  Constitutional: She is oriented to person, place, and time. She appears well-developed and well-nourished. No distress.  HENT:  Head: Normocephalic and atraumatic.  Eyes: Pupils are equal, round, and reactive to light. Conjunctivae and EOM are normal.  Neck: Neck supple. Carotid bruit is not present.  Cardiovascular: Normal rate, regular rhythm, normal heart sounds and intact distal pulses.  Pulmonary/Chest: Effort normal and breath sounds normal. No respiratory distress.  Abdominal: Soft. She exhibits no pulsatile midline mass. There is no tenderness.  Musculoskeletal: Normal range of motion.  Neurological: She is alert and oriented to person, place, and time.  Skin: Skin is warm and dry.  Psychiatric: She  has a normal mood and affect. Her behavior is normal.  Nursing note and vitals reviewed.   Vitals:   09/08/18 0804  BP: 109/73  Pulse: 88  Temp: 97.9 F (36.6 C)  TempSrc: Oral  SpO2: 96%  Weight: 169 lb 12.8 oz (77 kg)  Height: 5\' 8"  (1.727 m)       Assessment & Plan:   Brenda Johnston is a 52 y.o. female Hypothyroidism, unspecified type - Plan: T3, Free, T4, Free, levothyroxine (SYNTHROID, LEVOTHROID) 112 MCG tablet Anxiety state Fatigue, unspecified type - Plan: Iron, TIBC and Ferritin Panel, Vitamin B12, VITAMIN D 25 Hydroxy (Vit-D Deficiency, Fractures), Ambulatory referral to Sleep Studies  -Increased side effects at higher dose of 125 mcg.  Suspect some of her symptoms could be due  to multiple causes including possible obstructive sleep apnea with chronic snoring and daytime somnolence, as well as anxiety/mood symptoms.  However reports unable to tolerate previous SSRIs.  -Check free T4, T3, trial of Synthroid 112 mcg daily  -Check vitamin D, B12, iron studies at her request but previous CBC was reassuring.  -Aveeno or other lotion for dry skin/itchy skin.  -Recheck with primary care provider within the next 3 to 4 weeks.   Snoring - Plan: Ambulatory referral to Sleep Studies Daytime somnolence - Plan: Ambulatory referral to Sleep Studies  -Refer for sleep studies as above.  Screening for endocrine, nutritional, metabolic and immunity disorder - Plan: Iron, TIBC and Ferritin Panel, Vitamin B12, VITAMIN D 25 Hydroxy (Vit-D Deficiency, Fractures)   Meds ordered this encounter  Medications  . levothyroxine (SYNTHROID, LEVOTHROID) 112 MCG tablet    Sig: Take 1 tablet (112 mcg total) by mouth daily.    Dispense:  30 tablet    Refill:  1   Patient Instructions   Although some of your symptoms may be due to thyroid medication, I will check some other electrolytes and labs as we discussed.  For now okay to try 112 mcg Synthroid once per day.  I will check free T4 and T3 to look  at actual circulating thyroid hormone.  Additionally with the snoring and daytime fatigue, I will refer you to sleep specialist as a sleep study may be helpful.   Finally I would like you to follow-up in the next 3 to 4 weeks with Dr. Leretha PolSantiago to discuss the symptoms further, including anxiety as that could be related to thyroid or may need to look at other treatment options as we discussed.  Thank you for coming in today.Return to the clinic or go to the nearest emergency room if any of your symptoms worsen or new symptoms occur.   If you have lab work done today you will be contacted with your lab results within the next 2 weeks.  If you have not heard from us then please contact us. The fastest way to get your results is to register for My Chart.   IF you received an x-ray today, you will receive an invoice from [facility]. Please contact [facility] at [phone] with questions or concerns regarding your invoice.   IF you received labwork today, you will receive an invoice from [facility]. Please contact [facility] at [phone] with questions or concerns regarding your invoice.   Our billing staff will not be able to assist you with questions regarding bills from these companies.  You will be contacted with the lab results as soon as they are available. The fastest way to get your results is to activate your My Chart account. Instructions are located on the last page of this paperwork. If you have not heard from us regarding the results in 2 weeks, please contact this office.      I personally performed the services described in this documentation, which was scribed in my presence. The recorded information has been reviewed and considered for accuracy and completeness, addended by me as needed, and agree with information above.  Signed,   Meredith StaggersJeffrey Aleja Yearwood, MD Primary Care at [facility].  09/08/18 9:20 AM

## 2018-09-09 LAB — IRON,TIBC AND FERRITIN PANEL
Ferritin: 126 ng/mL (ref 15–150)
Iron Saturation: 34 % (ref 15–55)
Iron: 87 ug/dL (ref 27–159)
TIBC: 256 ug/dL (ref 250–450)
UIBC: 169 ug/dL (ref 131–425)

## 2018-09-09 LAB — T4, FREE: FREE T4: 1.62 ng/dL (ref 0.82–1.77)

## 2018-09-09 LAB — T3, FREE: T3 FREE: 2.9 pg/mL (ref 2.0–4.4)

## 2018-09-09 LAB — VITAMIN B12: Vitamin B-12: 406 pg/mL (ref 232–1245)

## 2018-09-09 LAB — VITAMIN D 25 HYDROXY (VIT D DEFICIENCY, FRACTURES): VIT D 25 HYDROXY: 35.4 ng/mL (ref 30.0–100.0)

## 2018-09-11 NOTE — Telephone Encounter (Signed)
Pt medication changed on Friday while in office an advising she feeling much better. Dgaddy, CMA

## 2018-09-11 NOTE — Telephone Encounter (Signed)
Left message on voicemail to return office call. Dgaddy, CMA 

## 2018-09-30 ENCOUNTER — Other Ambulatory Visit: Payer: Self-pay | Admitting: Family Medicine

## 2018-09-30 DIAGNOSIS — E039 Hypothyroidism, unspecified: Secondary | ICD-10-CM

## 2018-10-02 NOTE — Telephone Encounter (Signed)
Requested Prescriptions  Pending Prescriptions Disp Refills  . levothyroxine (SYNTHROID, LEVOTHROID) 112 MCG tablet [Pharmacy Med Name: LEVOTHYROXINE 112 MCG TABLET] 30 tablet 1    Sig: TAKE 1 TABLET BY MOUTH EVERY DAY     Endocrinology:  Hypothyroid Agents Failed - 09/30/2018  1:34 PM      Failed - TSH needs to be rechecked within 3 months after an abnormal result. Refill until TSH is due.      Failed - TSH in normal range and within 360 days    TSH  Date Value Ref Range Status  08/21/2018 8.620 (H) 0.450 - 4.500 uIU/mL Final         Passed - Valid encounter within last 12 months    Recent Outpatient Visits          3 weeks ago Hypothyroidism, unspecified type   Primary Care at [facility], Asencion Partridge, MD   1 month ago Hypothyroidism, unspecified type   Primary Care at [facility], Levell July, MD   3 months ago Hypothyroidism, unspecified type   [facility] at [facility], Meda Coffee, MD   4 months ago Hypothyroidism, unspecified type   [facility] at [facility], Meda Coffee, MD   5 months ago Anxiety with depression   Primary Care at [facility], Meda Coffee, MD      Future Appointments            In 4 days Myles Lipps, MD Primary Care at [facility], [facility]

## 2018-10-06 ENCOUNTER — Ambulatory Visit (INDEPENDENT_AMBULATORY_CARE_PROVIDER_SITE_OTHER): Payer: BLUE CROSS/BLUE SHIELD | Admitting: Family Medicine

## 2018-10-06 ENCOUNTER — Encounter: Payer: Self-pay | Admitting: Family Medicine

## 2018-10-06 ENCOUNTER — Other Ambulatory Visit: Payer: Self-pay

## 2018-10-06 VITALS — BP 114/74 | HR 69 | Temp 97.8°F | Resp 16 | Ht 68.0 in | Wt 169.4 lb

## 2018-10-06 DIAGNOSIS — Z1211 Encounter for screening for malignant neoplasm of colon: Secondary | ICD-10-CM

## 2018-10-06 DIAGNOSIS — E039 Hypothyroidism, unspecified: Secondary | ICD-10-CM

## 2018-10-06 DIAGNOSIS — F418 Other specified anxiety disorders: Secondary | ICD-10-CM

## 2018-10-06 DIAGNOSIS — Z1231 Encounter for screening mammogram for malignant neoplasm of breast: Secondary | ICD-10-CM

## 2018-10-06 DIAGNOSIS — R7303 Prediabetes: Secondary | ICD-10-CM

## 2018-10-06 MED ORDER — LEVOTHYROXINE SODIUM 112 MCG PO TABS: 112.0000 ug | ORAL_TABLET | Freq: Every day | ORAL | 1 refills | Status: DC

## 2018-10-06 NOTE — Patient Instructions (Signed)
Hypothyroidism Hypothyroidism is a disorder of the thyroid. The thyroid is a large gland that is located in the lower front of the neck. The thyroid releases hormones that control how the body works. With hypothyroidism, the thyroid does not make enough of these hormones. What are the causes? Causes of hypothyroidism may include:  Viral infections.  Pregnancy.  Your own defense system (immune system) attacking your thyroid.  Certain medicines.  Birth defects.  Past radiation treatments to your head or neck.  Past treatment with radioactive iodine.  Past surgical removal of part or all of your thyroid.  Problems with the gland that is located in the center of your brain (pituitary).  What are the signs or symptoms? Signs and symptoms of hypothyroidism may include:  Feeling as though you have no energy (lethargy).  Inability to tolerate cold.  Weight gain that is not explained by a change in diet or exercise habits.  Dry skin.  Coarse hair.  Menstrual irregularity.  Slowing of thought processes.  Constipation.  Sadness or depression.  How is this diagnosed? Your health care provider may diagnose hypothyroidism with blood tests and ultrasound tests. How is this treated? Hypothyroidism is treated with medicine that replaces the hormones that your body does not make. After you begin treatment, it may take several weeks for symptoms to go away. Follow these instructions at home:  Take medicines only as directed by your health care provider.  If you start taking any new medicines, tell your health care provider.  Keep all follow-up visits as directed by your health care provider. This is important. As your condition improves, your dosage needs may change. You will need to have blood tests regularly so that your health care provider can watch your condition. Contact a health care provider if:  Your symptoms do not get better with treatment.  You are taking  thyroid replacement medicine and: ? You sweat excessively. ? You have tremors. ? You feel anxious. ? You lose weight rapidly. ? You cannot tolerate heat. ? You have emotional swings. ? You have diarrhea. ? You feel weak. Get help right away if:  You develop chest pain.  You develop an irregular heartbeat.  You develop a rapid heartbeat. This information is not intended to replace advice given to you by your health care provider. Make sure you discuss any questions you have with your health care provider. Document Released: 12/13/2005 Document Revised: 05/20/2016 Document Reviewed: 04/30/2014 Elsevier Interactive Patient Education  [employer].  If you have lab work done today you will be contacted with your lab results within the next 2 weeks.  If you have not heard from [place] then please contact us. The fastest way to get your results is to register for My Chart.   IF you received an x-ray today, you will receive an invoice from [facility]. Please contact [facility] at [phone] with questions or concerns regarding your invoice.   IF you received labwork today, you will receive an invoice from [place]. Please contact [facility] at [phone] with questions or concerns regarding your invoice.   Our billing staff will not be able to assist you with questions regarding bills from these companies.  You will be contacted with the lab results as soon as they are available. The fastest way to get your results is to activate your My Chart account. Instructions are located on the last page of this paperwork. If you have not heard from [place] regarding the results  in 2 weeks, please contact this office.

## 2018-10-06 NOTE — Progress Notes (Signed)
10/11/201910:21 AM  Brenda Johnston 1966-05-19, 52 y.o. female [id number]  Chief Complaint  Patient presents with  . Hypothyroidism    recheck on her thryoid level; needs blood work  . Fatigue    recheck her fatigue level; states she is doing ok  . Medication Refill    Synthroid     HPI:   Patient is a 52 y.o. female with past medical history significant for hypothyroidism, depression anxiety who presents today for routine followup  Last visit with Dr Neva Seat, Sept  Levothyroxine decreased to due to being shaky, nervous on , doing much better Stopped taking lexapro, feels that mood was really affected by thyroid Overall mood is normal Fatigue much improved Sleeping well Did not to sleep study   Fall Risk  10/06/2018 09/08/2018 05/12/2018 04/14/2018  Falls in the past year? No No No No     Depression screen [facility] 2/9 10/06/2018 09/08/2018 05/12/2018  Decreased Interest 0 0 1  Down, Depressed, Hopeless 0 0 1  PHQ - 2 Score 0 0 2  Altered sleeping - - 1  Tired, decreased energy - - 1  Change in appetite - - 0  Feeling bad or failure about yourself  - - 1  Trouble concentrating - - 0  Moving slowly or fidgety/restless - - 1  Suicidal thoughts - - 0  PHQ-9 Score - - 6  Difficult doing work/chores - - Somewhat difficult    Allergies  Allergen Reactions  . Codeine   . Penicillins     Prior to Admission medications   Medication Sig Start Date End Date Taking? Authorizing Provider  levothyroxine (SYNTHROID, LEVOTHROID) 112 MCG tablet TAKE 1 TABLET BY MOUTH EVERY DAY 10/02/18  Yes Myles Lipps, MD    No past medical history on file.  No past surgical history on file.  Social History   Tobacco Use  . Smoking status: Current Every Day Smoker  . Smokeless tobacco: Never Used  Substance Use Topics  . Alcohol use: No    Family History  Problem Relation Age of Onset  . Cancer Mother   . Stroke Father   . Diabetes Sister   . Hypertension Brother      ROS Per hpi  OBJECTIVE:  Blood pressure 114/74, pulse 69, temperature 97.8 F (36.6 C), temperature source Oral, resp. rate 16, height 5\' 8"  (1.727 m), weight 169 lb 6.4 oz (76.8 kg), SpO2 96 %. Body mass index is 25.76 kg/m.   Physical Exam  Constitutional: She is oriented to person, place, and time. She appears well-developed and well-nourished.  HENT:  Head: Normocephalic and atraumatic.  Mouth/Throat: Mucous membranes are normal.  Eyes: Pupils are equal, round, and reactive to light. Conjunctivae and EOM are normal. No scleral icterus.  Neck: Neck supple.  Pulmonary/Chest: Effort normal.  Neurological: She is alert and oriented to person, place, and time.  Skin: Skin is warm and dry.  Psychiatric: She has a normal mood and affect.  Nursing note and vitals reviewed.  Lab Results  Component Value Date   TSH 8.620 (H) 08/21/2018    ASSESSMENT and PLAN  1. Hypothyroidism, unspecified type Improved symptom wise. Checking labs today, medications will be adjusted as needed.  - TSH - levothyroxine (SYNTHROID, LEVOTHROID) 112 MCG tablet; Take 1 tablet (112 mcg total) by mouth daily.  2. Pre-diabetes - Hemoglobin A1c  3. Anxiety with depression  4. Screening for colon cancer - Ambulatory referral to Gastroenterology  5. Visit for screening mammogram -  MM Digital Screening; Future  Return in about 6 months (around 04/07/2019) for hypothyroidism.    Myles Lipps, MD Primary Care at [facility] [address] [place], [place] [address] Ph.  [phone] Fax [phone]

## 2018-10-07 LAB — TSH: TSH: 5.21 u[IU]/mL — ABNORMAL HIGH (ref 0.450–4.500)

## 2018-10-07 LAB — HEMOGLOBIN A1C
Est. average glucose Bld gHb Est-mCnc: 108 mg/dL
Hgb A1c MFr Bld: 5.4 % (ref 4.8–5.6)

## 2018-10-15 NOTE — Progress Notes (Signed)
Patient ID: Brenda Johnston, female   DOB: [birth year], 52 y.o.   MRN: [id number] Lab only visit. Not seen by a provider.

## 2018-10-24 ENCOUNTER — Other Ambulatory Visit: Payer: Self-pay | Admitting: Family Medicine

## 2018-10-28 ENCOUNTER — Other Ambulatory Visit: Payer: Self-pay | Admitting: Family Medicine

## 2018-10-28 DIAGNOSIS — E039 Hypothyroidism, unspecified: Secondary | ICD-10-CM

## 2018-11-02 ENCOUNTER — Ambulatory Visit
Admission: RE | Admit: 2018-11-02 | Discharge: 2018-11-02 | Disposition: A | Discharge status: Home / Self Care | Payer: BLUE CROSS/BLUE SHIELD | Source: Ambulatory Visit | Attending: Family Medicine | Admitting: Family Medicine

## 2018-11-02 DIAGNOSIS — Z1231 Encounter for screening mammogram for malignant neoplasm of breast: Secondary | ICD-10-CM

## 2018-11-03 ENCOUNTER — Other Ambulatory Visit: Payer: Self-pay | Admitting: Family Medicine

## 2018-11-03 DIAGNOSIS — R928 Other abnormal and inconclusive findings on diagnostic imaging of breast: Secondary | ICD-10-CM

## 2018-11-06 ENCOUNTER — Other Ambulatory Visit: Payer: Self-pay | Admitting: Family Medicine

## 2018-11-06 DIAGNOSIS — E039 Hypothyroidism, unspecified: Secondary | ICD-10-CM

## 2018-11-06 MED ORDER — LEVOTHYROXINE SODIUM 112 MCG PO TABS: 112.0000 ug | ORAL_TABLET | Freq: Every day | ORAL | 1 refills | Status: DC

## 2018-11-06 NOTE — Telephone Encounter (Signed)
Copied from CRM [id number]. Topic: Quick Communication - Rx Refill/Question >> Nov 06, 2018  8:09 AM Lenoria Chime wrote: Medication: levothyroxine (SYNTHROID, LEVOTHROID) 112 MCG tablet  Has the patient contacted their pharmacy? Yes.   (Agent: If no, request that the patient contact the pharmacy for the refill.) (Agent: If yes, when and what did the pharmacy advise?)  Preferred Pharmacy (with phone number or street name): [id number] [place], [place] - [address]  Agent: Please be advised that RX refills may take up to 3 business days. We ask that you follow-up with your pharmacy.

## 2018-11-06 NOTE — Telephone Encounter (Signed)
LOV noted patient not due to recheck TSH until 04/20.

## 2018-11-09 ENCOUNTER — Ambulatory Visit
Admission: RE | Admit: 2018-11-09 | Discharge: 2018-11-09 | Disposition: A | Discharge status: Home / Self Care | Payer: BLUE CROSS/BLUE SHIELD | Source: Ambulatory Visit | Attending: Family Medicine | Admitting: Family Medicine

## 2018-11-09 ENCOUNTER — Ambulatory Visit: Payer: BLUE CROSS/BLUE SHIELD

## 2018-11-09 DIAGNOSIS — R928 Other abnormal and inconclusive findings on diagnostic imaging of breast: Secondary | ICD-10-CM | POA: Diagnosis not present

## 2018-11-22 ENCOUNTER — Other Ambulatory Visit: Payer: Self-pay | Admitting: Family Medicine

## 2018-11-22 NOTE — Telephone Encounter (Signed)
Requested Prescriptions  Pending Prescriptions Disp Refills  . levothyroxine (SYNTHROID, LEVOTHROID) 125 MCG tablet [Pharmacy Med Name: LEVOTHYROXINE 125 MCG TABLET] 90 tablet 1    Sig: TAKE 1 TABLET BY MOUTH EVERY DAY     Endocrinology:  Hypothyroid Agents Failed - 11/22/2018  3:25 AM      Failed - TSH needs to be rechecked within 3 months after an abnormal result. Refill until TSH is due.      Failed - TSH in normal range and within 360 days    TSH  Date Value Ref Range Status  10/06/2018 5.210 (H) 0.450 - 4.500 uIU/mL Final         Passed - Valid encounter within last 12 months    Recent Outpatient Visits          1 month ago Hypothyroidism, unspecified type   [facility], Meda CoffeeIrma M, MD   2 months ago Hypothyroidism, unspecified type   [facility], Asencion PartridgeJeffrey R, MD   3 months ago Hypothyroidism, unspecified type   [facility], Levell JulyEva N, MD   4 months ago Hypothyroidism, unspecified type   [facility], Meda CoffeeIrma M, MD   6 months ago Hypothyroidism, unspecified type   [facility], Meda CoffeeIrma M, MD      Future Appointments            In 4 months Myles LippsSantiago, Irma M, MD [facility]

## 2018-12-02 ENCOUNTER — Encounter: Payer: Self-pay | Admitting: Family Medicine

## 2018-12-02 ENCOUNTER — Other Ambulatory Visit: Payer: Self-pay

## 2018-12-02 ENCOUNTER — Ambulatory Visit: Payer: BLUE CROSS/BLUE SHIELD | Admitting: Family Medicine

## 2018-12-02 VITALS — BP 108/68 | HR 81 | Temp 98.0°F | Resp 16 | Ht 68.0 in | Wt 171.0 lb

## 2018-12-02 DIAGNOSIS — J209 Acute bronchitis, unspecified: Secondary | ICD-10-CM

## 2018-12-02 MED ORDER — AZITHROMYCIN 250 MG PO TABS: ORAL_TABLET | ORAL | 0 refills | Status: DC

## 2018-12-02 MED ORDER — PREDNISONE 20 MG PO TABS: 40.0000 mg | ORAL_TABLET | Freq: Every day | ORAL | 0 refills | Status: AC

## 2018-12-02 MED ORDER — BENZONATATE 100 MG PO CAPS: 100.0000 mg | ORAL_CAPSULE | Freq: Three times a day (TID) | ORAL | 0 refills | Status: DC | PRN

## 2018-12-02 NOTE — Progress Notes (Signed)
Patient ID: Brenda Johnston, female    DOB: 1966-07-08, 52 y.o.   MRN: [id number]  PCP: Myles Lipps, MD  Chief Complaint  Patient presents with  . Cough    chronic x 4 days with chest congestion     Subjective:  HPI Brenda Johnston is a 52 y.o. female presents for evaluation evaluation of worsening cough and chest congestions. Patient is current smoker. Suffers from a current cough. Noticed cough has been worsened over the course of the last 4 days. She has tried albuterol in the past without relief of cough or shortness of breath. Current symptoms include chest congestions, cough productive, scratchy throat, and wheezing. In the past she has obtain resolution of symptoms with antibiotic and prednisone.  She has attempted relief with Mucinex and benzonatate with minimal relief of cough. Social History   Socioeconomic History  . Marital status: Married    Spouse name: Not on file  . Number of children: Not on file  . Years of education: Not on file  . Highest education level: Not on file  Occupational History  . Not on file  Social Needs  . Financial resource strain: Not on file  . Food insecurity:    Worry: Not on file    Inability: Not on file  . Transportation needs:    Medical: Not on file    Non-medical: Not on file  Tobacco Use  . Smoking status: Current Every Day Smoker  . Smokeless tobacco: Never Used  Substance and Sexual Activity  . Alcohol use: No  . Drug use: No  . Sexual activity: Yes  Lifestyle  . Physical activity:    Days per week: Not on file    Minutes per session: Not on file  . Stress: Not on file  Relationships  . Social connections:    Talks on phone: Not on file    Gets together: Not on file    Attends religious service: Not on file    Active member of club or organization: Not on file    Attends meetings of clubs or organizations: Not on file    Relationship status: Not on file  . Intimate partner violence:    Fear of current or ex partner: Not on  file    Emotionally abused: Not on file    Physically abused: Not on file    Forced sexual activity: Not on file  Other Topics Concern  . Not on file  Social History Narrative  . Not on file    Family History  Problem Relation Age of Onset  . Cancer Mother   . Stroke Father   . Diabetes Sister   . Hypertension Brother    Review of Systems  Pertinent negatives listed in HPI  There are no active problems to display for this patient.   Allergies  Allergen Reactions  . Codeine   . Penicillins     Prior to Admission medications   Medication Sig Start Date End Date Taking? Authorizing Provider  levothyroxine (SYNTHROID, LEVOTHROID) 112 MCG tablet Take 1 tablet (112 mcg total) by mouth daily. 11/06/18  Yes Myles Lipps, MD    Past Medical, Surgical Family and Social History reviewed and updated.    Objective:   Today's Vitals   12/02/18 0927  BP: 108/68  Pulse: 81  Resp: 16  Temp: 98 F (36.7 C)  TempSrc: Oral  SpO2: 93%  Weight: 171 lb (77.6 kg)  Height: 5\' 8"  (1.727 m)  Wt Readings from Last 3 Encounters:  12/02/18 171 lb (77.6 kg)  10/06/18 169 lb 6.4 oz (76.8 kg)  09/08/18 169 lb 12.8 oz (77 kg)     Physical Exam General appearance: alert, well developed, well nourished, cooperative and in no distress Head: Normocephalic, without obvious abnormality, atraumatic Respiratory: Persistent hacking, dry, and cough. Respirations even and unlabored, normal respiratory rate Heart: rate and rhythm normal. No gallop or murmurs noted on exam  Extremities: No gross deformities Skin: Skin color, texture, turgor normal. No rashes seen  Psych: Appropriate mood and affect. Neurologic: Mental status: Alert, oriented to person, place, and time, thought content appropriate.  Lab Results  Component Value Date   POCGLU 105 (A) 07/09/2013    Lab Results  Component Value Date   HGBA1C 5.4 10/06/2018     Assessment & Plan:  1. Acute bronchitis, unspecified  organism, uncomplicated, non-worrisome exam: Treatment prescribed as follows: Meds ordered this encounter  Medications  . azithromycin (ZITHROMAX) 250 MG tablet    Sig: Take 2 tabs PO x 1 dose, then 1 tab PO QD x 4 days    Dispense:  6 tablet    Refill:  0  . benzonatate (TESSALON) 100 MG capsule    Sig: Take 1-2 capsules (100-200 mg total) by mouth 3 (three) times daily as needed for cough.    Dispense:  40 capsule    Refill:  0  . predniSONE (DELTASONE) 20 MG tablet    Sig: Take 2 tablets (40 mg total) by mouth daily with breakfast for 5 days.    Dispense:  10 tablet    Refill:  0       -The patient was given clear instructions to go to ER or return to medical center if symptoms do not improve, worsen or new problems develop. The patient verbalized understanding.     Godfrey PickKimberly S. Tiburcio PeaHarris, FNP-C Nurse Practitioner (PRN Staff)  [facility] [address] [place], [place]  [phone]

## 2018-12-02 NOTE — Patient Instructions (Addendum)
Take benadryl 25-50 mg at bedtime with benzonatate for sleep and to help with nighttime cough.  Take all medications as prescribed.  If you have lab work done today you will be contacted with your lab results within the next 2 weeks.  If you have not heard from us then please contact us. The fastest way to get your results is to register for My Chart.   IF you received an x-ray today, you will receive an invoice from [facility]. Please contact [facility] at [phone] with questions or concerns regarding your invoice.   IF you received labwork today, you will receive an invoice from [facility]. Please contact [facility] at [phone] with questions or concerns regarding your invoice.   Our billing staff will not be able to assist you with questions regarding bills from these companies.  You will be contacted with the lab results as soon as they are available. The fastest way to get your results is to activate your My Chart account. Instructions are located on the last page of this paperwork. If you have not heard from us regarding the results in 2 weeks, please contact this office.       Acute Bronchitis, Adult Acute bronchitis is when air tubes (bronchi) in the lungs suddenly get swollen. The condition can make it hard to breathe. It can also cause these symptoms:  A cough.  Coughing up clear, yellow, or green mucus.  Wheezing.  Chest congestion.  Shortness of breath.  A fever.  Body aches.  Chills.  A sore throat.  Follow these instructions at home: Medicines  Take over-the-counter and prescription medicines only as told by your doctor.  If you were prescribed an antibiotic medicine, take it as told by your doctor. Do not stop taking the antibiotic even if you start to feel better. General instructions  Rest.  Drink enough fluids to keep your pee (urine) clear or pale yellow.  Avoid smoking and secondhand smoke. If you smoke and you need help  quitting, ask your doctor. Quitting will help your lungs heal faster.  Use an inhaler, cool mist vaporizer, or humidifier as told by your doctor.  Keep all follow-up visits as told by your doctor. This is important. How is this prevented? To lower your risk of getting this condition again:  Wash your hands often with soap and water. If you cannot use soap and water, use hand sanitizer.  Avoid contact with people who have cold symptoms.  Try not to touch your hands to your mouth, nose, or eyes.  Make sure to get the flu shot every year.  Contact a doctor if:  Your symptoms do not get better in 2 weeks. Get help right away if:  You cough up blood.  You have chest pain.  You have very bad shortness of breath.  You become dehydrated.  You faint (pass out) or keep feeling like you are going to pass out.  You keep throwing up (vomiting).  You have a very bad headache.  Your fever or chills gets worse. This information is not intended to replace advice given to you by your health care provider. Make sure you discuss any questions you have with your health care provider. Document Released: 05/31/2008 Document Revised: 07/21/2016 Document Reviewed: 06/02/2016 Elsevier Interactive Patient Education  [employer]2018 Elsevier Inc.

## 2019-01-11 ENCOUNTER — Encounter: Payer: Self-pay | Admitting: Family Medicine

## 2019-01-19 ENCOUNTER — Telehealth: Payer: Self-pay | Admitting: Family Medicine

## 2019-01-19 NOTE — Telephone Encounter (Signed)
Copied from CRM [id number]. Topic: Quick Communication - See Telephone Encounter >> Jan 19, 2019  8:58 AM Jens Som A wrote: CRM for notification. See Telephone encounter for: 01/19/19.  Patient has not been feeling good.  She thinks her thyroid is off balance. Requesting an order to her her thyroid checked. Please advise [phone]

## 2019-01-22 ENCOUNTER — Other Ambulatory Visit: Payer: Self-pay

## 2019-01-22 ENCOUNTER — Ambulatory Visit (INDEPENDENT_AMBULATORY_CARE_PROVIDER_SITE_OTHER): Payer: BLUE CROSS/BLUE SHIELD | Admitting: Family Medicine

## 2019-01-22 DIAGNOSIS — E039 Hypothyroidism, unspecified: Secondary | ICD-10-CM | POA: Diagnosis not present

## 2019-01-22 NOTE — Telephone Encounter (Signed)
Placed order for pt to come into the office for a nurse visit and make a follow-up appointment.

## 2019-01-22 NOTE — Progress Notes (Signed)
Lab visit only. 

## 2019-01-23 LAB — TSH: TSH: 3.31 u[IU]/mL (ref 0.450–4.500)

## 2019-03-23 ENCOUNTER — Other Ambulatory Visit: Payer: Self-pay | Admitting: *Deleted

## 2019-03-23 DIAGNOSIS — E039 Hypothyroidism, unspecified: Secondary | ICD-10-CM

## 2019-03-26 ENCOUNTER — Telehealth: Payer: Self-pay | Admitting: Family Medicine

## 2019-03-26 NOTE — Telephone Encounter (Signed)
Left VM to let patient know she can do her OV completely over the phone due to not having access to the technology to do a Webex appt.

## 2019-03-30 ENCOUNTER — Telehealth (INDEPENDENT_AMBULATORY_CARE_PROVIDER_SITE_OTHER): Payer: BLUE CROSS/BLUE SHIELD | Admitting: Family Medicine

## 2019-03-30 ENCOUNTER — Other Ambulatory Visit: Payer: Self-pay

## 2019-03-30 DIAGNOSIS — K219 Gastro-esophageal reflux disease without esophagitis: Secondary | ICD-10-CM | POA: Insufficient documentation

## 2019-03-30 DIAGNOSIS — R5383 Other fatigue: Secondary | ICD-10-CM

## 2019-03-30 DIAGNOSIS — E039 Hypothyroidism, unspecified: Secondary | ICD-10-CM

## 2019-03-30 MED ORDER — OMEPRAZOLE 20 MG PO CPDR: 20.0000 mg | DELAYED_RELEASE_CAPSULE | Freq: Every day | ORAL | 3 refills | Status: DC

## 2019-03-30 NOTE — Progress Notes (Signed)
Thyroid issue, no energy and waking up cold all the time since last blood work. About several month now.

## 2019-03-30 NOTE — Progress Notes (Signed)
Virtual Visit via telephone Note  I connected with patient on 03/30/19 at 926am by telephone and verified that I am speaking with the correct person using two identifiers. Brenda Johnston is currently located at home and patient is currently with her during visit. The provider, Myles Lipps, MD is located in their office at time of visit.  I discussed the limitations, risks, security and privacy concerns of performing an evaluation and management service by telephone and the availability of in person appointments. I also discussed with the patient that there may be a patient responsible charge related to this service. The patient expressed understanding and agreed to proceed.  No chief complaint on file.   Telephone visit today for followup hypothroidism  HPI ? Last OV Oct 2019 Feeling like her thyroid is off: feeling really cold and no energy, has gained about 10 lbs for about 2 months Denies any skipped doses, denies any new pills Has been starting to drink coffee with her medication Denies any goiter, neck tenderness Having issues with reflux, increased appetite, denies any black tarry stools Denies any fever or chills, no chest pain, no SOB Has mild cough for past week since she started working in her yard Having dried and brittle hair Very dry skin  Lab Results  Component Value Date   TSH 3.310 01/22/2019    Fall Risk  03/30/2019 12/02/2018 10/06/2018 09/08/2018 05/12/2018  Falls in the past year? 0 0 No No No  Number falls in past yr: 0 0 - - -  Injury with Fall? 0 0 - - -  Follow up Follow up appointment - - - -     Depression screen [facility] 2/9 03/30/2019 12/02/2018 10/06/2018  Decreased Interest 0 0 0  Down, Depressed, Hopeless 0 0 0  PHQ - 2 Score 0 0 0  Altered sleeping - - -  Tired, decreased energy - - -  Change in appetite - - -  Feeling bad or failure about yourself  - - -  Trouble concentrating - - -  Moving slowly or fidgety/restless - - -  Suicidal thoughts - - -   PHQ-9 Score - - -  Difficult doing work/chores - - -    Allergies  Allergen Reactions  . Codeine   . Penicillins     Prior to Admission medications   Medication Sig Start Date End Date Taking? Authorizing Provider  levothyroxine (SYNTHROID, LEVOTHROID) 112 MCG tablet Take 1 tablet (112 mcg total) by mouth daily. 11/06/18  Yes Myles Lipps, MD    No past medical history on file.  No past surgical history on file.  Social History   Tobacco Use  . Smoking status: Current Every Day Smoker  . Smokeless tobacco: Never Used  Substance Use Topics  . Alcohol use: No    Family History  Problem Relation Age of Onset  . Cancer Mother   . Stroke Father   . Diabetes Sister   . Hypertension Brother     ROS Per hpi  Objective  Vitals as reported by the patient: none  There were no vitals filed for this visit.  ASSESSMENT and PLAN  1. Hypothyroidism, unspecified type Checking labs today, medications will be adjusted as needed.   2. Fatigue, unspecified type - CBC; Future - Comprehensive metabolic panel; Future  3. Gastroesophageal reflux disease, esophagitis presence not specified New diagnosis, discussed dietary changes. Starting omeprazole.   Other orders - omeprazole (PRILOSEC) 20 MG capsule; Take 1 capsule (20 mg  total) by mouth daily.  FOLLOW-UP: per labs   The above assessment and management plan was discussed with the patient. The patient verbalized understanding of and has agreed to the management plan. Patient is aware to call the clinic if symptoms persist or worsen. Patient is aware when to return to the clinic for a follow-up visit. Patient educated on when it is appropriate to go to the emergency department.    I provided 13 minutes of non-face-to-face time during this encounter.  Myles Lipps, MD Primary Care at [facility] [address] [place], [place] [address] Ph.  [phone] Fax [phone]

## 2019-04-02 ENCOUNTER — Other Ambulatory Visit: Payer: Self-pay

## 2019-04-02 ENCOUNTER — Ambulatory Visit (INDEPENDENT_AMBULATORY_CARE_PROVIDER_SITE_OTHER): Payer: BLUE CROSS/BLUE SHIELD | Admitting: Family Medicine

## 2019-04-02 DIAGNOSIS — R5383 Other fatigue: Secondary | ICD-10-CM | POA: Diagnosis not present

## 2019-04-02 DIAGNOSIS — E039 Hypothyroidism, unspecified: Secondary | ICD-10-CM

## 2019-04-03 LAB — COMPREHENSIVE METABOLIC PANEL
ALT: 17 IU/L (ref 0–32)
AST: 13 IU/L (ref 0–40)
Albumin/Globulin Ratio: 2.2 (ref 1.2–2.2)
Albumin: 4.3 g/dL (ref 3.8–4.9)
Alkaline Phosphatase: 100 IU/L (ref 39–117)
BUN/Creatinine Ratio: 15 (ref 9–23)
BUN: 14 mg/dL (ref 6–24)
Bilirubin Total: 0.4 mg/dL (ref 0.0–1.2)
CO2: 21 mmol/L (ref 20–29)
Calcium: 9.6 mg/dL (ref 8.7–10.2)
Chloride: 104 mmol/L (ref 96–106)
Creatinine, Ser: 0.92 mg/dL (ref 0.57–1.00)
GFR calc Af Amer: 83 mL/min/{1.73_m2} (ref >59)
GFR calc non Af Amer: 72 mL/min/{1.73_m2} (ref >59)
Globulin, Total: 2 g/dL (ref 1.5–4.5)
Glucose: 90 mg/dL (ref 65–99)
Potassium: 4.2 mmol/L (ref 3.5–5.2)
Sodium: 142 mmol/L (ref 134–144)
Total Protein: 6.3 g/dL (ref 6.0–8.5)

## 2019-04-03 LAB — CBC
Hematocrit: 44.3 % (ref 34.0–46.6)
Hemoglobin: 14.7 g/dL (ref 11.1–15.9)
MCH: 31.5 pg (ref 26.6–33.0)
MCHC: 33.2 g/dL (ref 31.5–35.7)
MCV: 95 fL (ref 79–97)
Platelets: 270 10*3/uL (ref 150–450)
RBC: 4.66 x10E6/uL (ref 3.77–5.28)
RDW: 12.5 % (ref 11.7–15.4)
WBC: 6.9 10*3/uL (ref 3.4–10.8)

## 2019-04-03 LAB — T3, FREE: T3, Free: 3 pg/mL (ref 2.0–4.4)

## 2019-04-03 LAB — T4, FREE: Free T4: 1.51 ng/dL (ref 0.82–1.77)

## 2019-04-03 LAB — TSH: TSH: 3.86 u[IU]/mL (ref 0.450–4.500)

## 2019-04-22 IMAGING — MG DIGITAL SCREENING BILATERAL MAMMOGRAM WITH CAD
4 series · 4 of 4 positions shown · non-contrast
Comparison: None.

CLINICAL DATA: Screening. Baseline.

EXAM:
DIGITAL SCREENING BILATERAL MAMMOGRAM WITH CAD

[R MLO]
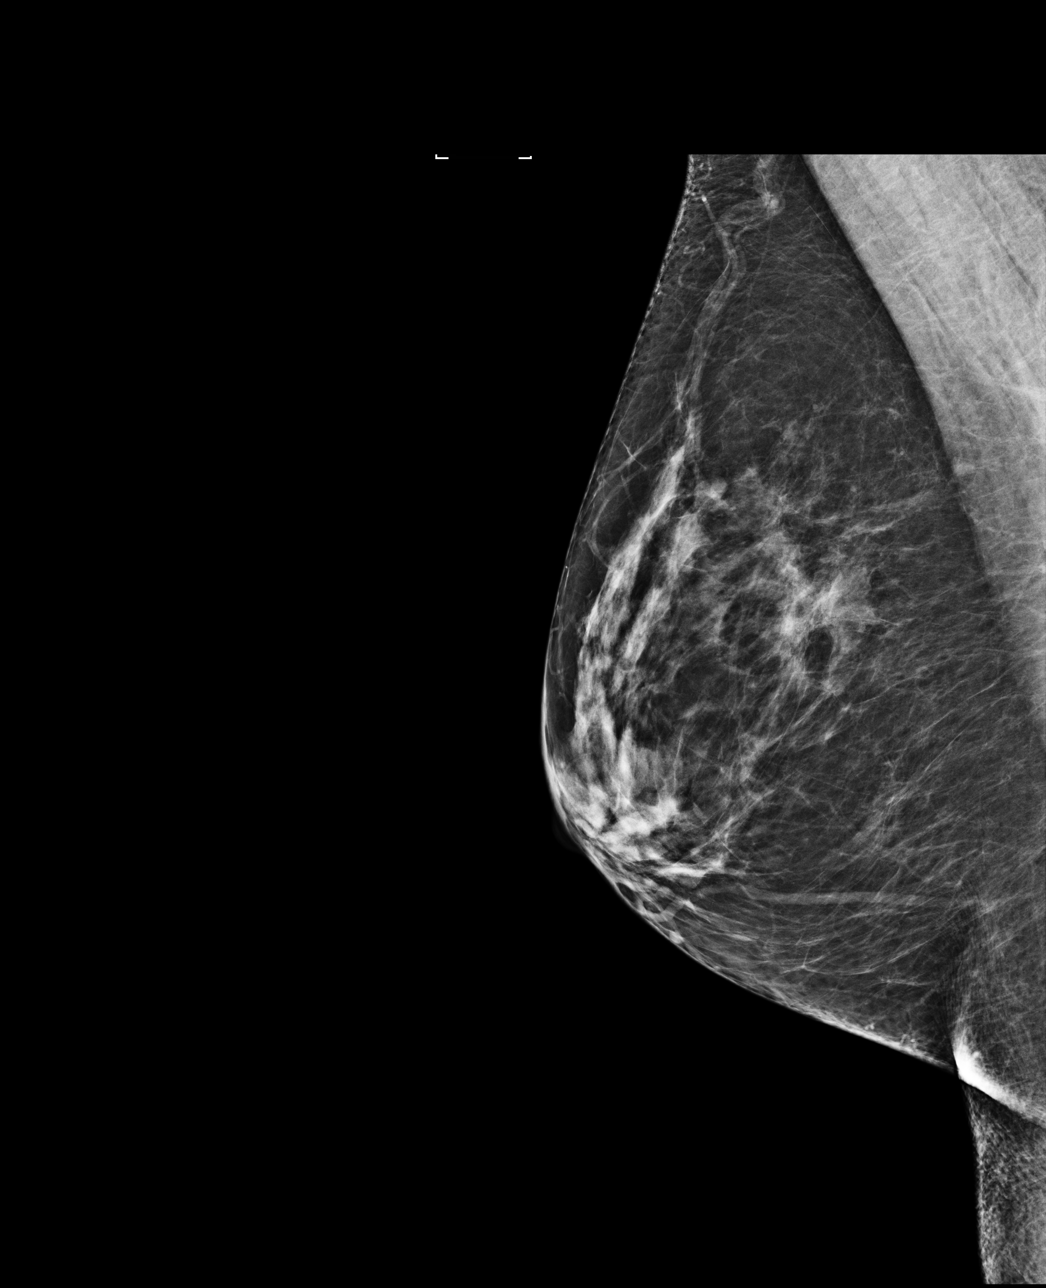

[L MLO]
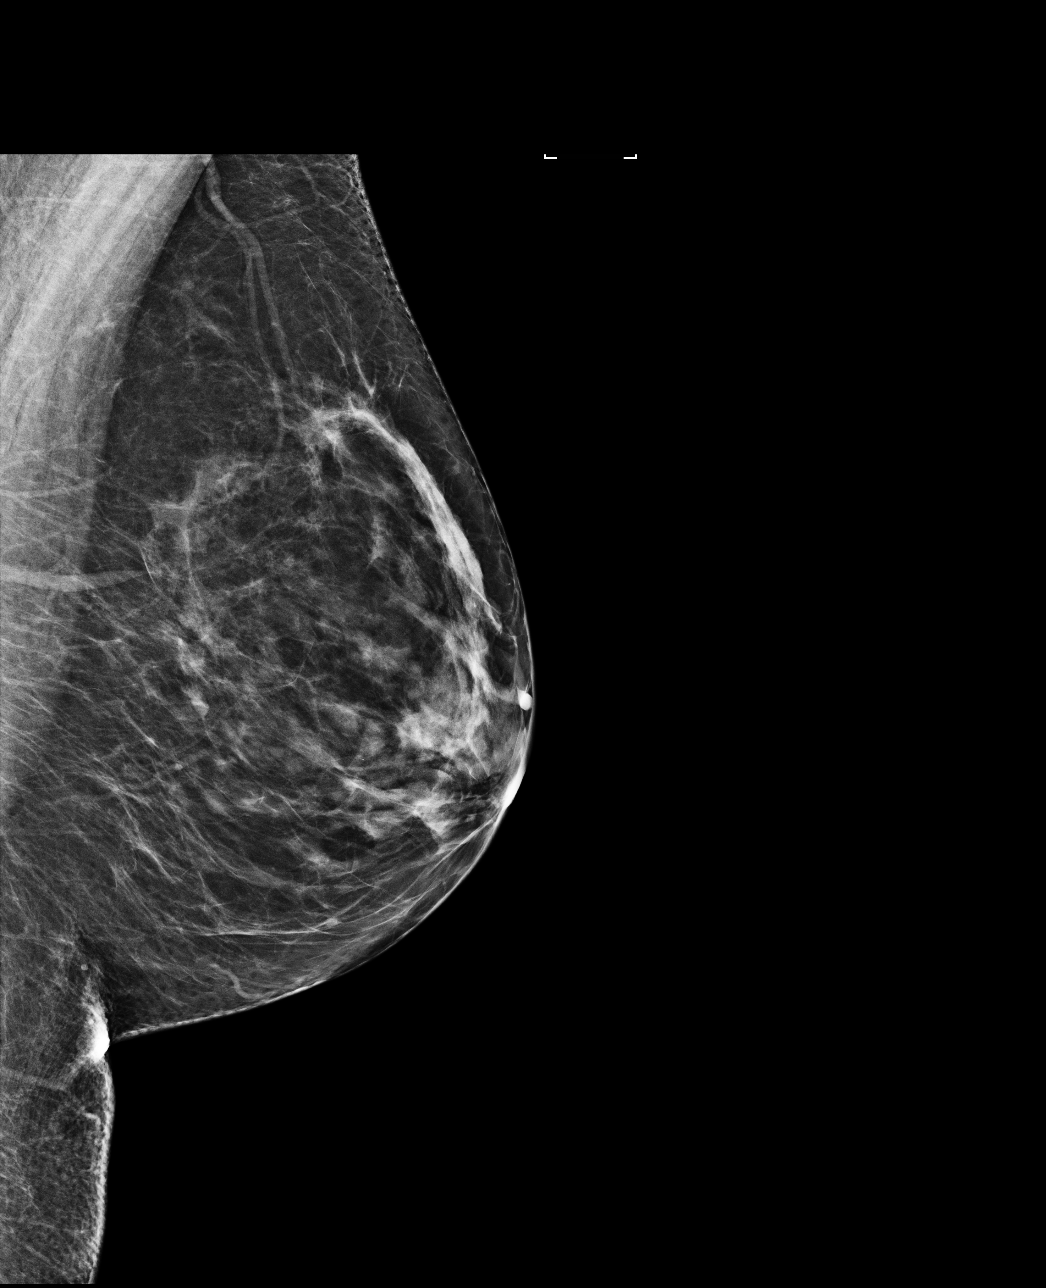

[L CC]
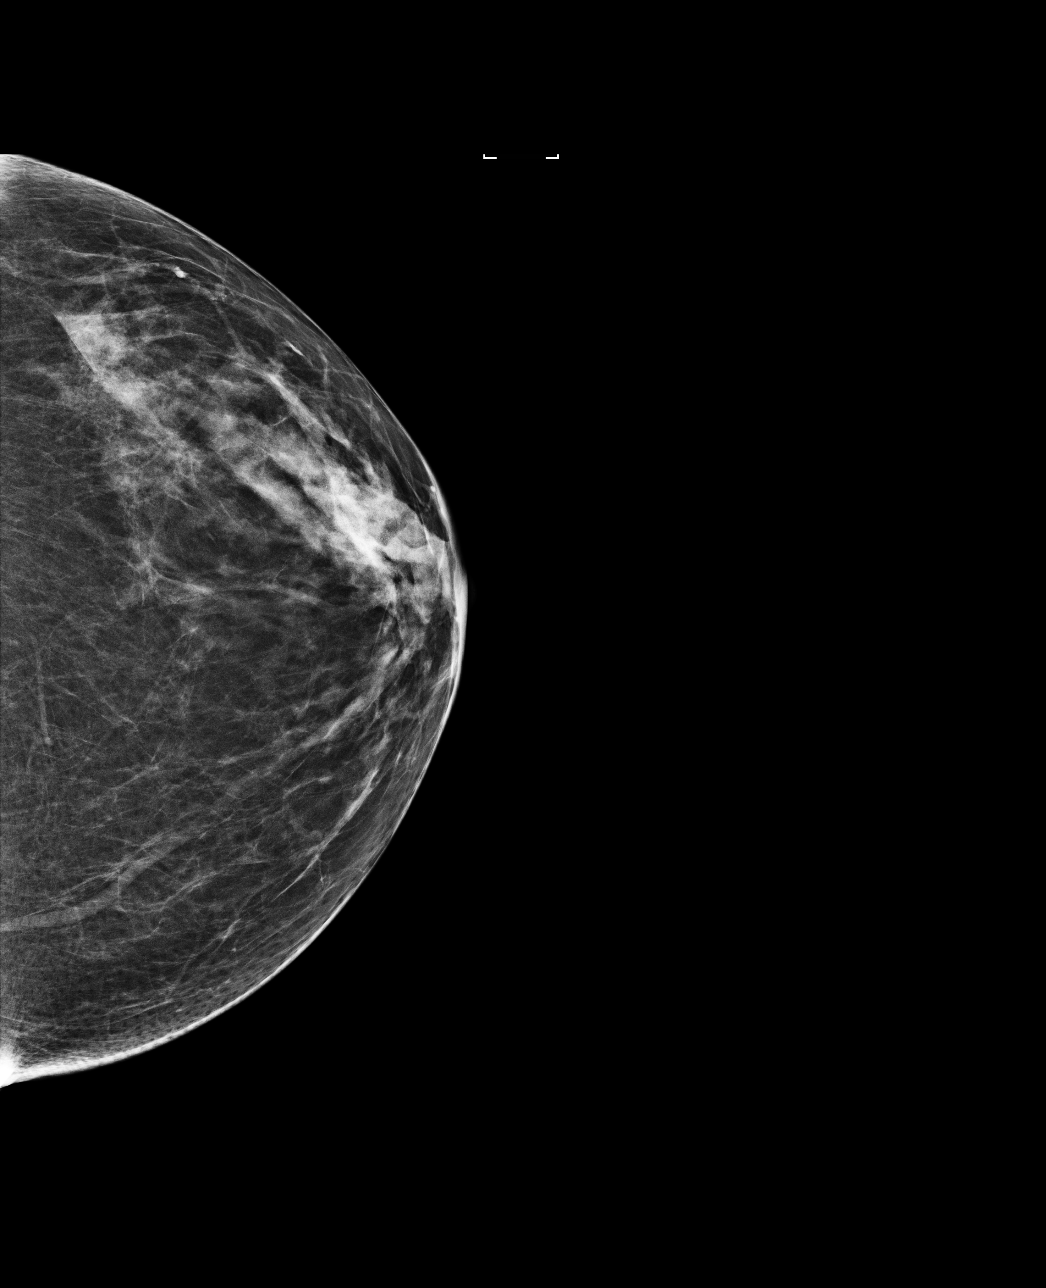

[R CC]
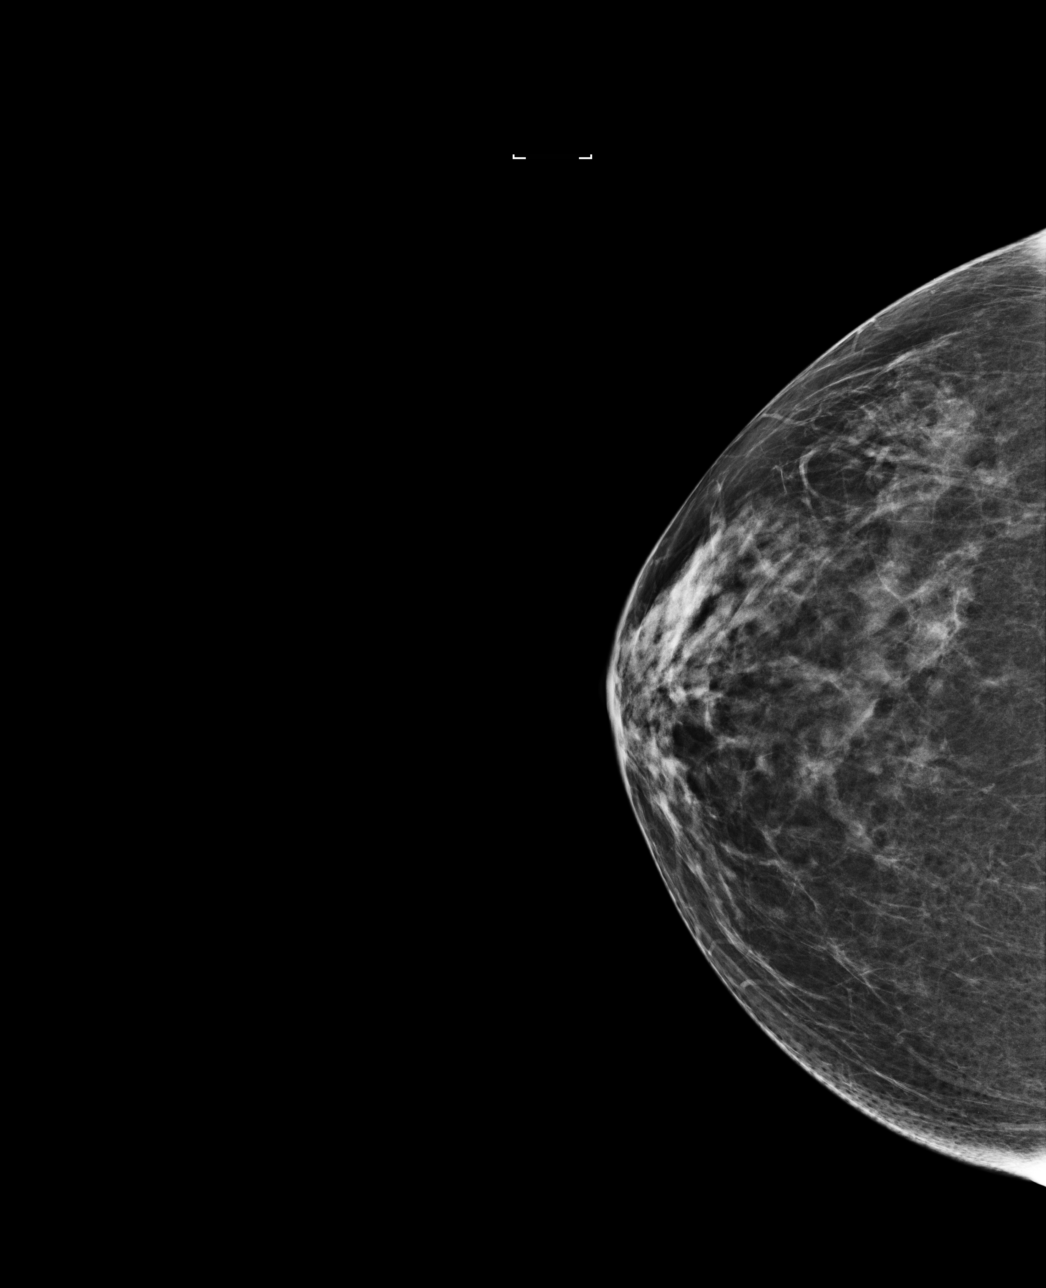

[4 of 4 positions shown; findings below may reference images not displayed]

ACR Breast Density Category b: There are scattered areas of
fibroglandular density.
FINDINGS: In the right breast, a possible asymmetry warrants further
evaluation. This possible asymmetry is seen within the upper RIGHT
breast, at middle to posterior depth, MLO view only.

In the left breast, no findings suspicious for malignancy. Images
were processed with CAD.
IMPRESSION: Further evaluation is suggested for possible asymmetry in the right
breast.

RECOMMENDATION:
Diagnostic mammogram and possibly ultrasound of the right breast.
(Code:S8-1-HHC)

The patient will be contacted regarding the findings, and additional
imaging will be scheduled.

BI-RADS CATEGORY  0: Incomplete. Need additional imaging evaluation
and/or prior mammograms for comparison.

## 2019-04-23 ENCOUNTER — Telehealth: Payer: Self-pay | Admitting: Family Medicine

## 2019-04-23 MED ORDER — CYCLOBENZAPRINE HCL 10 MG PO TABS: 10.0000 mg | ORAL_TABLET | Freq: Three times a day (TID) | ORAL | 0 refills | Status: DC | PRN

## 2019-04-23 MED ORDER — MELOXICAM 15 MG PO TABS: 15.0000 mg | ORAL_TABLET | Freq: Every day | ORAL | 0 refills | Status: DC

## 2019-04-23 NOTE — Telephone Encounter (Signed)
Patient stated she needs an inflammation medication for her back flare  She would like someone to call her

## 2019-04-23 NOTE — Telephone Encounter (Signed)
Please let patient that meds were sent. thanks

## 2019-04-26 ENCOUNTER — Other Ambulatory Visit: Payer: Self-pay

## 2019-04-26 ENCOUNTER — Telehealth (INDEPENDENT_AMBULATORY_CARE_PROVIDER_SITE_OTHER): Payer: BLUE CROSS/BLUE SHIELD | Admitting: Family Medicine

## 2019-04-26 ENCOUNTER — Ambulatory Visit: Payer: Self-pay | Admitting: *Deleted

## 2019-04-26 DIAGNOSIS — J441 Chronic obstructive pulmonary disease with (acute) exacerbation: Secondary | ICD-10-CM

## 2019-04-26 MED ORDER — MOMETASONE FURO-FORMOTEROL FUM 200-5 MCG/ACT IN AERO: 2.0000 | INHALATION_SPRAY | Freq: Two times a day (BID) | RESPIRATORY_TRACT | Status: DC

## 2019-04-26 MED ORDER — ALBUTEROL SULFATE HFA 108 (90 BASE) MCG/ACT IN AERS: 2.0000 | INHALATION_SPRAY | Freq: Four times a day (QID) | RESPIRATORY_TRACT | 5 refills | Status: DC | PRN

## 2019-04-26 MED ORDER — AZITHROMYCIN 250 MG PO TABS: ORAL_TABLET | ORAL | 0 refills | Status: DC

## 2019-04-26 MED ORDER — PREDNISONE 20 MG PO TABS: ORAL_TABLET | ORAL | 0 refills | Status: DC

## 2019-04-26 MED ORDER — BENZONATATE 200 MG PO CAPS: 200.0000 mg | ORAL_CAPSULE | Freq: Two times a day (BID) | ORAL | 0 refills | Status: DC | PRN

## 2019-04-26 NOTE — Progress Notes (Signed)
Telemedicine Encounter- SOAP NOTE Established Patient  I discussed the limitations, risks, security and privacy concerns of performing an evaluation and management service by telephone and the availability of in person appointments. I also discussed with the patient that there may be a patient responsible charge related to this service. The patient expressed understanding and agreed to proceed.  This telephone encounter was conducted with the patient's (or proxy's) verbal consent via audio telecommunications: yes Patient was instructed to have this encounter in a suitably private space; and to only have persons present to whom they give permission to participate. In addition, patient identity was confirmed by use of name plus two identifiers (DOB and address).  I spent a total of 20min talking with the patient  Cc: Fever and chills on Mid-Monday. Running a fever 99 oF right now and headaches right now. Cough and nasal congestion  Subjective   Brenda Johnston is a 53 y.o. female established patient. Telephone visit today for possible COVID symptoms  HPI Pt with chills and fever on Monday. Pt with headaches on Tuesday. Pt states fever 101. Pt with cough-dry and headache. Excedrin extra strength takes OTC.No diarrhea.  Headache-frontal.  No acute illness in home.   No exposure. Corporate investment bankerConstruction worker   COPD diagnosed in the past.  Tobacco use 1 pk/day. Pt using albuterol MDI. No recent exacerbation.  Pt completed pulmonary function test. -has used oral prednisone in the past-has out of date MDI-Dulera and albuterol-no nebuizer  Patient Active Problem List   Diagnosis Date Noted  . Hypothyroidism 03/30/2019  . Gastroesophageal reflux disease 03/30/2019    No past medical history on file.  Current Outpatient Medications  Medication Sig Dispense Refill  . levothyroxine (SYNTHROID, LEVOTHROID) 112 MCG tablet Take 1 tablet (112 mcg total) by mouth daily. 90 tablet 1  . cyclobenzaprine (FLEXERIL)  10 MG tablet Take 1 tablet (10 mg total) by mouth 3 (three) times daily as needed for muscle spasms. (Patient not taking: Reported on 04/26/2019) 30 tablet 0  . meloxicam (MOBIC) 15 MG tablet Take 1 tablet (15 mg total) by mouth daily. (Patient not taking: Reported on 04/26/2019) 30 tablet 0  . omeprazole (PRILOSEC) 20 MG capsule Take 1 capsule (20 mg total) by mouth daily. (Patient not taking: Reported on 04/26/2019) 30 capsule 3   No current facility-administered medications for this visit.     Allergies  Allergen Reactions  . Codeine   . Penicillins     Social History   Socioeconomic History  . Marital status: Married    Spouse name: Not on file  . Number of children: Not on file  . Years of education: Not on file  . Highest education level: Not on file  Occupational History  . Not on file  Social Needs  . Financial resource strain: Not on file  . Food insecurity:    Worry: Not on file    Inability: Not on file  . Transportation needs:    Medical: Not on file    Non-medical: Not on file  Tobacco Use  . Smoking status: Current Every Day Smoker  . Smokeless tobacco: Never Used  Substance and Sexual Activity  . Alcohol use: No  . Drug use: No  . Sexual activity: Yes  Lifestyle  . Physical activity:    Days per week: Not on file    Minutes per session: Not on file  . Stress: Not on file  Relationships  . Social connections:    Talks on phone:  Not on file    Gets together: Not on file    Attends religious service: Not on file    Active member of club or organization: Not on file    Attends meetings of clubs or organizations: Not on file    Relationship status: Not on file  . Intimate partner violence:    Fear of current or ex partner: Not on file    Emotionally abused: Not on file    Physically abused: Not on file    Forced sexual activity: Not on file  Other Topics Concern  . Not on file  Social History Narrative  . Not on file    ROS CONSTITUTIONAL:  fever  EENT: sinus problems and  nasal congestion, NO, sore throat, NOfacial tenderness CV: no chest pain,  RESP: no SOB, cough GI: no diarrhea,  GU:no  pain with urination, INTEG: no skin changes NEURO:frequent headaches NO, double vision, NO weakness,   Objective   Vitals as reported by the patient: NONE 1. COPD exacerbation (HCC) mometasone-formoterol (DULERA) 200-5 MCG/ACT inhaler 2 puff-start after prednisone completed Albuterol-rescue inhaler-risk/benefit/side effects, zpack, prednisone-risk/benefit/side effects Concern for possible COVID as trigger-no exposure-elevated temp If no improvement, pt to have CXR completed-details discussed on worsening symptoms that would prompt pt to be seen at ER. Pt in quarantined at home. Husband with no symptoms.  I discussed the assessment and treatment plan with the patient. The patient was provided an opportunity to ask questions and all were answered. The patient agreed with the plan and demonstrated an understanding of the instructions.   The patient was advised to call back or seek an in-person evaluation if the symptoms worsen or if the condition fails to improve as anticipated.  I provided 20 minutes of non-face-to-face time during this encounter.  LISA Mat Carne, MD  [facility] at [facility] 04-26-19

## 2019-04-26 NOTE — Telephone Encounter (Signed)
Low grade fever for 3 days. 99.6 today. "severe headaches" does improve with execedrin extreme. The headaches are with pressure above her eyes. Nasal congestion not able to blow anything out. Started using flonase yesterday. Non productive cough that" feels like something needs to come up". Denies n/v/sob/cp/body aches/diarrhea. Has copd and wheezes normally but the wheezing has increased this week. No travels/no known contact. She has been quarantining at home for weeks.  Warm transferred to PCP for virtual appointment.  Reason for Disposition . Fever present > 3 days (72 hours)  Answer Assessment - Initial Assessment Questions 1. TEMPERATURE: "What is the most recent temperature?"  "How was it measured?"      101 2 days ago oral. Today, 99.6 oral. 2. ONSET: "When did the fever start?"      2 days ago 3. SYMPTOMS: "Do you have any other symptoms besides the fever?"  (e.g., colds, headache, sore throat, earache, cough, rash, diarrhea, vomiting, abdominal pain)     Headaches daily, nasal congestion, dry cough. 4. CAUSE: If there are no symptoms, ask: "What do you think is causing the fever?"       5. CONTACTS: "Does anyone else in the family have an infection?"     no 6. TREATMENT: "What have you done so far to treat this fever?" (e.g., medications)     excedrin extreme, probiotic daily 7. IMMUNOCOMPROMISE: "Do you have of the following: diabetes, HIV positive, splenectomy, cancer chemotherapy, chronic steroid treatment, transplant patient, etc."     none 8. PREGNANCY: "Is there any chance you are pregnant?" "When was your last menstrual period?"     no 9. TRAVEL: "Have you traveled out of the country in the last month?" (e.g., travel history, exposures)     no  Protocols used: FEVER-A-AH

## 2019-04-26 NOTE — Progress Notes (Signed)
Fever and chills on Mid-Monday. Running a fever 99 oF right now and headaches right now. Cough and nasal congestion.

## 2019-04-27 DIAGNOSIS — J441 Chronic obstructive pulmonary disease with (acute) exacerbation: Secondary | ICD-10-CM | POA: Insufficient documentation

## 2019-04-27 MED ORDER — MOMETASONE FURO-FORMOTEROL FUM 200-5 MCG/ACT IN AERO: 2.0000 | INHALATION_SPRAY | Freq: Two times a day (BID) | RESPIRATORY_TRACT | 1 refills | Status: DC

## 2019-05-17 ENCOUNTER — Ambulatory Visit: Payer: Self-pay | Admitting: *Deleted

## 2019-05-17 ENCOUNTER — Telehealth (INDEPENDENT_AMBULATORY_CARE_PROVIDER_SITE_OTHER): Payer: BLUE CROSS/BLUE SHIELD | Admitting: Family Medicine

## 2019-05-17 DIAGNOSIS — R300 Dysuria: Secondary | ICD-10-CM

## 2019-05-17 MED ORDER — PHENAZOPYRIDINE HCL 100 MG PO TABS: 100.0000 mg | ORAL_TABLET | Freq: Three times a day (TID) | ORAL | 0 refills | Status: DC | PRN

## 2019-05-17 MED ORDER — NITROFURANTOIN MONOHYD MACRO 100 MG PO CAPS: 100.0000 mg | ORAL_CAPSULE | Freq: Two times a day (BID) | ORAL | 0 refills | Status: DC

## 2019-05-17 NOTE — Telephone Encounter (Signed)
Patient called stating she is having symptoms of a UTI. She is having frequent urination and burning. She would like something called in because she is unable to come into the office. She was tested for Covid-19 and she hasn't gotten her results back yet. [facility] pharmacy is [id number] [place], [place] - [address] [phone] (Phone) [phone] (Fax)  Call to office- they are going to schedule patient and talk to her about labs.  Reason for Disposition . Urinating more frequently than usual (i.e., frequency)  Answer Assessment - Initial Assessment Questions 1. SYMPTOM: "What's the main symptom you're concerned about?" (e.g., frequency, incontinence)     Burning with urination and frequency 2. ONSET: "When did the  symptoms  start?"     4-5 days 3. PAIN: "Is there any pain?" If so, ask: "How bad is it?" (Scale: 1-10; mild, moderate, severe)     Yes- 8- severe 4. CAUSE: "What do you think is causing the symptoms?"     UTI 5. OTHER SYMPTOMS: "Do you have any other symptoms?" (e.g., fever, flank pain, blood in urine, pain with urination)     Flank pain- patient is not sure her symptoms are not related to COVID- she is awaiting results 6. PREGNANCY: "Is there any chance you are pregnant?" "When was your last menstrual period?"     n/a  Protocols used: URINARY [facility]

## 2019-05-17 NOTE — Progress Notes (Signed)
Having frequent urination for the past 5 days with burning, has not used any otc for the symptoms. Has has uti in the past, however it was a long time ago, symptoms feels familiar. She says she has been drinking lots of water for treatment. Also wanted to note that she did get tested for the corona virus on last Friday suggested by Dr. Judee Clara. She is still awaiting her results.

## 2019-05-17 NOTE — Telephone Encounter (Signed)
Patient seen via televisit

## 2019-05-17 NOTE — Progress Notes (Signed)
Virtual Visit Note  I connected with patient on 05/17/19 at 913am by phone and verified that I am speaking with the correct person using two identifiers. Brenda Johnston is currently located at home and patient is currently with them during visit. The provider, Myles Lipps, MD is located in their office at time of visit.  I discussed the limitations, risks, security and privacy concerns of performing an evaluation and management service by telephone and the availability of in person appointments. I also discussed with the patient that there may be a patient responsible charge related to this service. The patient expressed understanding and agreed to proceed.   CC: dysuria  HPI ? SOB, cough and headaches are getting better Fevers have resolved Pending covid 19 results  Dysuria for past 4-5 days Urgency with little UOP Drinking lots of water Denies any hematuria Denies any flank pain Denies any vaginal discharge or pelvic pain Denies any nausea or vomiting   Allergies  Allergen Reactions  . Codeine   . Penicillins     Prior to Admission medications   Medication Sig Start Date End Date Taking? Authorizing Provider  albuterol (VENTOLIN HFA) 108 (90 Base) MCG/ACT inhaler Inhale 2 puffs into the lungs every 6 (six) hours as needed for wheezing or shortness of breath. 04/26/19   Wandra Feinstein, MD  azithromycin (ZITHROMAX) 250 MG tablet Take 2 po today then 1 po day 2-5 04/26/19   Corum, Minerva Fester, MD  benzonatate (TESSALON) 200 MG capsule Take 1 capsule (200 mg total) by mouth 2 (two) times daily as needed for cough. 04/26/19   Corum, Minerva Fester, MD  cyclobenzaprine (FLEXERIL) 10 MG tablet Take 1 tablet (10 mg total) by mouth 3 (three) times daily as needed for muscle spasms. Patient not taking: Reported on 04/26/2019 04/23/19   Myles Lipps, MD  levothyroxine (SYNTHROID, LEVOTHROID) 112 MCG tablet Take 1 tablet (112 mcg total) by mouth daily. 11/06/18   Myles Lipps, MD  meloxicam  (MOBIC) 15 MG tablet Take 1 tablet (15 mg total) by mouth daily. Patient not taking: Reported on 04/26/2019 04/23/19   Myles Lipps, MD  mometasone-formoterol [facility]) 200-5 MCG/ACT AERO Inhale 2 puffs into the lungs 2 (two) times a day. 04/27/19   Corum, Minerva Fester, MD  omeprazole (PRILOSEC) 20 MG capsule Take 1 capsule (20 mg total) by mouth daily. Patient not taking: Reported on 04/26/2019 03/30/19   Myles Lipps, MD  predniSONE (DELTASONE) 20 MG tablet Take one BID x 5 days 04/26/19   Wandra Feinstein, MD    No past medical history on file.  No past surgical history on file.  Social History   Tobacco Use  . Smoking status: Current Every Day Smoker  . Smokeless tobacco: Never Used  Substance Use Topics  . Alcohol use: No    Family History  Problem Relation Age of Onset  . Cancer Mother   . Stroke Father   . Diabetes Sister   . Hypertension Brother     ROS Per hpi  Objective  Vitals as reported by the patient: none   ASSESSMENT and PLAN  1. Dysuria Treating empirically as self isolating due to suspect covid 19, pending results. Discussed with patient risk of doing so. Discussed RTC precautions. Discussed meds r/se/b. - POCT urinalysis dipstick; Future  Other orders - nitrofurantoin, macrocrystal-monohydrate, (MACROBID) 100 MG capsule; Take 1 capsule (100 mg total) by mouth 2 (two) times daily. - phenazopyridine (PYRIDIUM) 100 MG tablet; Take  1 tablet (100 mg total) by mouth 3 (three) times daily as needed for pain.  FOLLOW-UP: prn   The above assessment and management plan was discussed with the patient. The patient verbalized understanding of and has agreed to the management plan. Patient is aware to call the clinic if symptoms persist or worsen. Patient is aware when to return to the clinic for a follow-up visit. Patient educated on when it is appropriate to go to the emergency department.    I provided 13 minutes of non-face-to-face time during this encounter.   Myles LippsIrma M Santiago, MD [facility] at [facility] [address] [place] [address] [place], [place] [address] Ph.  [phone] Fax [phone]

## 2019-05-19 ENCOUNTER — Other Ambulatory Visit: Payer: Self-pay | Admitting: Family Medicine

## 2019-05-22 ENCOUNTER — Other Ambulatory Visit: Payer: Self-pay | Admitting: Family Medicine

## 2019-05-22 DIAGNOSIS — E039 Hypothyroidism, unspecified: Secondary | ICD-10-CM

## 2019-05-24 ENCOUNTER — Telehealth: Payer: Self-pay | Admitting: Family Medicine

## 2019-05-24 NOTE — Telephone Encounter (Signed)
Copied from CRM [id number]. Topic: Quick Communication - See Telephone Encounter >> May 24, 2019  8:39 AM Aretta Nip wrote: CRM for notification. See Telephone encounter for: 05/24/19.nitrofurantoin, macrocrystal-monohydrate, (MACROBID) 100 MG capsule DR Leretha Pol had given her this for a UTI and told her if the symptoms were not cleared up to call her back, she is a little better but still having a strong urgency. If she wants to FU # is [phone]. It is fine if she just calls in something else to [id number] [place], [place] - [address] AT [address] [phone] (Phone) [phone] (Fax)   All medicine has been taken!

## 2019-05-24 NOTE — Telephone Encounter (Signed)
Pt called to check the status. Please advise

## 2019-05-25 ENCOUNTER — Other Ambulatory Visit: Payer: Self-pay | Admitting: Family Medicine

## 2019-05-25 DIAGNOSIS — R300 Dysuria: Secondary | ICD-10-CM

## 2019-05-25 MED ORDER — PHENAZOPYRIDINE HCL 100 MG PO TABS: 100.0000 mg | ORAL_TABLET | Freq: Three times a day (TID) | ORAL | 0 refills | Status: DC | PRN

## 2019-05-25 NOTE — Telephone Encounter (Signed)
I refilled the pyridium which helps with bladder discomfort. We need to check her urine in order to provide guidance. Has her covid test results come back yet? Once negative results return she can come in your urine test, lab pended. thanks

## 2019-05-25 NOTE — Telephone Encounter (Signed)
No covid results as of yet

## 2019-05-25 NOTE — Telephone Encounter (Signed)
Pt state that she went to a Drive thru to get the COVID testing Done. She has received the letter stating that she was neg and will bring in that letter so we can scan into chart when she  Come give her sample. She will call us to schedule lab only visit

## 2019-05-25 NOTE — Telephone Encounter (Signed)
You saw this patient on 05/17/2019, she states that she took her week supply you gave her and it is a litter better but still having urgency. She says you stated that if not completely gone you would give her another round. Please advise as how you would like to handle this.

## 2019-05-28 ENCOUNTER — Ambulatory Visit: Payer: BLUE CROSS/BLUE SHIELD

## 2019-05-28 ENCOUNTER — Other Ambulatory Visit: Payer: Self-pay

## 2019-05-28 DIAGNOSIS — R300 Dysuria: Secondary | ICD-10-CM

## 2019-05-30 ENCOUNTER — Encounter: Payer: Self-pay | Admitting: Family Medicine

## 2019-05-30 LAB — URINE CULTURE

## 2019-05-30 NOTE — Telephone Encounter (Signed)
Please advise on pt concerns below

## 2019-06-21 ENCOUNTER — Other Ambulatory Visit: Payer: Self-pay | Admitting: Family Medicine

## 2019-08-15 ENCOUNTER — Other Ambulatory Visit: Payer: Self-pay | Admitting: Family Medicine

## 2019-08-15 DIAGNOSIS — E039 Hypothyroidism, unspecified: Secondary | ICD-10-CM

## 2019-08-31 ENCOUNTER — Other Ambulatory Visit: Payer: Self-pay

## 2019-08-31 ENCOUNTER — Encounter: Payer: Self-pay | Admitting: Family Medicine

## 2019-08-31 ENCOUNTER — Ambulatory Visit: Payer: BLUE CROSS/BLUE SHIELD | Admitting: Family Medicine

## 2019-08-31 VITALS — BP 134/82 | HR 68 | Temp 98.5°F | Ht 68.0 in | Wt 170.0 lb

## 2019-08-31 DIAGNOSIS — M545 Low back pain, unspecified: Secondary | ICD-10-CM

## 2019-08-31 MED ORDER — MELOXICAM 15 MG PO TABS: 15.0000 mg | ORAL_TABLET | Freq: Every day | ORAL | 0 refills | Status: DC

## 2019-08-31 MED ORDER — TRAMADOL HCL 50 MG PO TABS: 50.0000 mg | ORAL_TABLET | Freq: Three times a day (TID) | ORAL | 0 refills | Status: AC | PRN

## 2019-08-31 MED ORDER — TIZANIDINE HCL 4 MG PO TABS: 2.0000 mg | ORAL_TABLET | Freq: Four times a day (QID) | ORAL | 0 refills | Status: DC | PRN

## 2019-08-31 NOTE — Progress Notes (Signed)
9/4/202011:09 AM  Brenda Johnston 24-Jan-1966, 53 y.o., female [id number]  Chief Complaint  Patient presents with  . 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 female with past medical history significant for hypothyroidism and COPD who presents today for back pain  Has been told she has scoliosis Works painting homes Last week woke up with sudden right low back, radiates to her right hip, stabbing pain No numbness or tingling, no weakness Has tried APAP, ibuprofen, flexeril, heat and ice  [facility] 08/31/2019 05/17/2019 04/26/2019  Decreased Interest 0 0 0  Down, Depressed, Hopeless 0 0 0  PHQ - 2 Score 0 0 0  Altered sleeping - - -  Tired, decreased energy - - -  Change in appetite - - -  Feeling bad or failure about yourself  - - -  Trouble concentrating - - -  Moving slowly or fidgety/restless - - -  Suicidal thoughts - - -  PHQ-9 Score - - -  Difficult doing work/chores - - -    Fall Risk  08/31/2019 05/17/2019 04/26/2019 03/30/2019 12/02/2018  Falls in the past year? 0 0 0 0 0  Number falls in past yr: 0 0 0 0 0  Injury with Fall? 0 0 0 0 0  Follow up - - - Follow up appointment -     Allergies  Allergen Reactions  . Codeine   . Penicillins     Prior to Admission medications   Medication Sig Start Date End Date Taking? Authorizing Provider  levothyroxine (SYNTHROID) 112 MCG tablet TAKE 1 TABLET BY MOUTH EVERY DAY 08/15/19  Yes Myles LippsSantiago, Alexes Lamarque M, MD  omeprazole (PRILOSEC) 20 MG capsule TAKE 1 CAPSULE BY MOUTH EVERY DAY 06/21/19  Yes Myles LippsSantiago, Alcee Sipos M, MD  mometasone-formoterol [facility](DULERA) 200-5 MCG/ACT AERO Inhale 2 puffs into the lungs 2 (two) times a day. 04/27/19   Wandra Feinsteinorum, Lisa L, MD    History reviewed. No pertinent past medical history.  History reviewed. No pertinent surgical history.  Social History   Tobacco Use  . Smoking status: Current Every Day Smoker  . Smokeless tobacco: Never Used   Substance Use Topics  . Alcohol use: No    Family History  Problem Relation Age of Onset  . Cancer Mother   . Stroke Father   . Diabetes Sister   . Hypertension Brother     ROS Per hpi  OBJECTIVE:  Today's Vitals   08/31/19 1048  BP: 134/82  Pulse: 68  Temp: 98.5 F (36.9 C)  TempSrc: Oral  SpO2: 95%  Weight: 170 lb (77.1 kg)  Height: 5\' 8"  (1.727 m)   Body mass index is 25.85 kg/m.   Physical Exam Vitals signs and nursing note reviewed.  Constitutional:      Appearance: She is well-developed.  HENT:     Head: Normocephalic and atraumatic.  Eyes:     General: No scleral icterus.    Conjunctiva/sclera: Conjunctivae normal.     Pupils: Pupils are equal, round, and reactive to light.  Neck:     Musculoskeletal: Neck supple.  Pulmonary:     Effort: Pulmonary effort is normal.  Musculoskeletal:     Right hip: Normal.     Lumbar back: She exhibits no bony tenderness, no swelling and no spasm.     Comments: Neg SLR  Skin:    General: Skin is warm and dry.  Neurological:     Mental Status: She is alert and oriented to person, place, and time.     No results found for this or any previous visit (from the past 24 hour(s)).  No results found.   ASSESSMENT and PLAN  1. Acute right-sided low back pain without sciatica Discussed supportive measures, new meds r/se/b and RTC precautions. Patient educational handout given. pmp reviewed.  Other orders - meloxicam (MOBIC) 15 MG tablet; Take 1 tablet (15 mg total) by mouth daily. - tiZANidine (ZANAFLEX) 4 MG tablet; Take 0.5-1 tablets (2-4 mg total) by mouth every 6 (six) hours as needed for muscle spasms. - traMADol (ULTRAM) 50 MG tablet; Take 1 tablet (50 mg total) by mouth every 8 (eight) hours as needed for up to 5 days.  Return if symptoms worsen or fail to improve.    Rutherford Guys, MD Primary Care at [place], [place] [address] Ph.  [phone] Fax [phone]

## 2019-08-31 NOTE — Patient Instructions (Addendum)
   If you have lab work done today you will be contacted with your lab results within the next 2 weeks.  If you have not heard from us then please contact us. The fastest way to get your results is to register for My Chart.   IF you received an x-ray today, you will receive an invoice from [facility]. Please contact [facility] at [phone] with questions or concerns regarding your invoice.   IF you received labwork today, you will receive an invoice from [facility]. Please contact [facility] at [phone] with questions or concerns regarding your invoice.   Our billing staff will not be able to assist you with questions regarding bills from these companies.  You will be contacted with the lab results as soon as they are available. The fastest way to get your results is to activate your My Chart account. Instructions are located on the last page of this paperwork. If you have not heard from us regarding the results in 2 weeks, please contact this office.     Low Back Sprain or Strain Rehab Ask your health care provider which exercises are safe for you. Do exercises exactly as told by your health care provider and adjust them as directed. It is normal to feel mild stretching, pulling, tightness, or discomfort as you do these exercises. Stop right away if you feel sudden pain or your pain gets worse. Do not begin these exercises until told by your health care provider. Stretching and range-of-motion exercises These exercises warm up your muscles and joints and improve the movement and flexibility of your back. These exercises also help to relieve pain, numbness, and tingling. Lumbar rotation  1. Lie on your back on a firm surface and bend your knees. 2. Straighten your arms out to your sides so each arm forms a 90-degree angle (right angle) with a side of your body. 3. Slowly move (rotate) both of your knees to one side of your body until you feel a stretch in your lower  back (lumbar). Try not to let your shoulders lift off the floor. 4. Hold this position for __________ seconds. 5. Tense your abdominal muscles and slowly move your knees back to the starting position. 6. Repeat this exercise on the other side of your body. Repeat __________ times. Complete this exercise __________ times a day. Single knee to chest  1. Lie on your back on a firm surface with both legs straight. 2. Bend one of your knees. Use your hands to move your knee up toward your chest until you feel a gentle stretch in your lower back and buttock. ? Hold your leg in this position by holding on to the front of your knee. ? Keep your other leg as straight as possible. 3. Hold this position for __________ seconds. 4. Slowly return to the starting position. 5. Repeat with your other leg. Repeat __________ times. Complete this exercise __________ times a day. Prone extension on elbows  1. Lie on your abdomen on a firm surface (prone position). 2. Prop yourself up on your elbows. 3. Use your arms to help lift your chest up until you feel a gentle stretch in your abdomen and your lower back. ? This will place some of your body weight on your elbows. If this is uncomfortable, try stacking pillows under your chest. ? Your hips should stay down, against the surface that you are lying on. Keep your hip and back muscles relaxed. 4. Hold this position for __________   seconds. 5. Slowly relax your upper body and return to the starting position. Repeat __________ times. Complete this exercise __________ times a day. Strengthening exercises These exercises build strength and endurance in your back. Endurance is the ability to use your muscles for a long time, even after they get tired. Pelvic tilt This exercise strengthens the muscles that lie deep in the abdomen. 1. Lie on your back on a firm surface. Bend your knees and keep your feet flat on the floor. 2. Tense your abdominal muscles. Tip your  pelvis up toward the ceiling and flatten your lower back into the floor. ? To help with this exercise, you may place a small towel under your lower back and try to push your back into the towel. 3. Hold this position for __________ seconds. 4. Let your muscles relax completely before you repeat this exercise. Repeat __________ times. Complete this exercise __________ times a day. Alternating arm and leg raises  1. Get on your hands and knees on a firm surface. If you are on a hard floor, you may want to use padding, such as an exercise mat, to cushion your knees. 2. Line up your arms and legs. Your hands should be directly below your shoulders, and your knees should be directly below your hips. 3. Lift your left leg behind you. At the same time, raise your right arm and straighten it in front of you. ? Do not lift your leg higher than your hip. ? Do not lift your arm higher than your shoulder. ? Keep your abdominal and back muscles tight. ? Keep your hips facing the ground. ? Do not arch your back. ? Keep your balance carefully, and do not hold your breath. 4. Hold this position for __________ seconds. 5. Slowly return to the starting position. 6. Repeat with your right leg and your left arm. Repeat __________ times. Complete this exercise __________ times a day. Abdominal set with straight leg raise  1. Lie on your back on a firm surface. 2. Bend one of your knees and keep your other leg straight. 3. Tense your abdominal muscles and lift your straight leg up, 4-6 inches (10-15 cm) off the ground. 4. Keep your abdominal muscles tight and hold this position for __________ seconds. ? Do not hold your breath. ? Do not arch your back. Keep it flat against the ground. 5. Keep your abdominal muscles tense as you slowly lower your leg back to the starting position. 6. Repeat with your other leg. Repeat __________ times. Complete this exercise __________ times a day. Single leg lower with bent  knees 1. Lie on your back on a firm surface. 2. Tense your abdominal muscles and lift your feet off the floor, one foot at a time, so your knees and hips are bent in 90-degree angles (right angles). ? Your knees should be over your hips and your lower legs should be parallel to the floor. 3. Keeping your abdominal muscles tense and your knee bent, slowly lower one of your legs so your toe touches the ground. 4. Lift your leg back up to return to the starting position. ? Do not hold your breath. ? Do not let your back arch. Keep your back flat against the ground. 5. Repeat with your other leg. Repeat __________ times. Complete this exercise __________ times a day. Posture and body mechanics Good posture and healthy body mechanics can help to relieve stress in your body's tissues and joints. Body mechanics refers to the movements and   positions of your body while you do your daily activities. Posture is part of body mechanics. Good posture means:  Your spine is in its natural S-curve position (neutral).  Your shoulders are pulled back slightly.  Your head is not tipped forward. Follow these guidelines to improve your posture and body mechanics in your everyday activities. Standing   When standing, keep your spine neutral and your feet about hip width apart. Keep a slight bend in your knees. Your ears, shoulders, and hips should line up.  When you do a task in which you stand in one place for a long time, place one foot up on a stable object that is 2-4 inches (5-10 cm) high, such as a footstool. This helps keep your spine neutral. Sitting   When sitting, keep your spine neutral and keep your feet flat on the floor. Use a footrest, if necessary, and keep your thighs parallel to the floor. Avoid rounding your shoulders, and avoid tilting your head forward.  When working at a desk or a computer, keep your desk at a height where your hands are slightly lower than your elbows. Slide your chair  under your desk so you are close enough to maintain good posture.  When working at a computer, place your monitor at a height where you are looking straight ahead and you do not have to tilt your head forward or downward to look at the screen. Resting  When lying down and resting, avoid positions that are most painful for you.  If you have pain with activities such as sitting, bending, stooping, or squatting, lie in a position in which your body does not bend very much. For example, avoid curling up on your side with your arms and knees near your chest (fetal position).  If you have pain with activities such as standing for a long time or reaching with your arms, lie with your spine in a neutral position and bend your knees slightly. Try the following positions: ? Lying on your side with a pillow between your knees. ? Lying on your back with a pillow under your knees. Lifting   When lifting objects, keep your feet at least shoulder width apart and tighten your abdominal muscles.  Bend your knees and hips and keep your spine neutral. It is important to lift using the strength of your legs, not your back. Do not lock your knees straight out.  Always ask for help to lift heavy or awkward objects. This information is not intended to replace advice given to you by your health care provider. Make sure you discuss any questions you have with your health care provider. Document Released: 12/13/2005 Document Revised: 04/06/2019 Document Reviewed: 01/04/2019 Elsevier Patient Education  2020 [employer]  

## 2019-09-07 DIAGNOSIS — M5416 Radiculopathy, lumbar region: Secondary | ICD-10-CM | POA: Diagnosis not present

## 2019-09-07 DIAGNOSIS — M62830 Muscle spasm of back: Secondary | ICD-10-CM | POA: Diagnosis not present

## 2019-09-23 ENCOUNTER — Other Ambulatory Visit: Payer: Self-pay | Admitting: Family Medicine

## 2019-11-25 ENCOUNTER — Other Ambulatory Visit: Payer: Self-pay | Admitting: Family Medicine

## 2019-11-25 DIAGNOSIS — E039 Hypothyroidism, unspecified: Secondary | ICD-10-CM

## 2020-02-17 ENCOUNTER — Other Ambulatory Visit: Payer: Self-pay | Admitting: Family Medicine

## 2020-02-17 DIAGNOSIS — E039 Hypothyroidism, unspecified: Secondary | ICD-10-CM

## 2020-02-17 NOTE — Telephone Encounter (Signed)
Requested medication (s) are due for refill today  Yes  Requested medication (s) are on the active medication list   Yes  Future visit scheduled  No  LOV 08/31/19  Last labs 04/02/19. Thyroid labs overdue. Routing to provider for consideration.   Requested Prescriptions  Pending Prescriptions Disp Refills   levothyroxine (SYNTHROID) 112 MCG tablet [Pharmacy Med Name: LEVOTHYROXINE 112 MCG TABLET] 90 tablet 0    Sig: TAKE 1 TABLET BY MOUTH EVERY DAY      Endocrinology:  Hypothyroid Agents Failed - 02/17/2020 12:53 AM      Failed - TSH needs to be rechecked within 3 months after an abnormal result. Refill until TSH is due.      Passed - TSH in normal range and within 360 days    TSH  Date Value Ref Range Status  04/02/2019 3.860 0.450 - 4.500 uIU/mL Final          Passed - Valid encounter within last 12 months    Recent Outpatient Visits           5 months ago Acute right-sided low back pain without sciatica   [facility], Meda Coffee, MD   9 months ago Dysuria   [facility], Meda Coffee, MD   9 months ago COPD exacerbation [facility])   [facility], Minerva Fester, MD   10 months ago Fatigue, unspecified type   [facility], Meda Coffee, MD   10 months ago Hypothyroidism, unspecified type   [facility], Meda Coffee, MD

## 2020-02-20 ENCOUNTER — Other Ambulatory Visit: Payer: Self-pay

## 2020-02-20 NOTE — Telephone Encounter (Signed)
What is the name of the medication?   levothyroxine (SYNTHROID) 112 MCG tablet [Pharmacy Med Name: LEVOTHYROXINE 112 MCG TABLET] 90 tablet 0    Sig: TAKE 1 TABLET BY MOUTH EVERY DAY     Have you contacted your pharmacy to request a refill? yes  Which pharmacy would you like this sent to?   [id number] [place], [place] - [address]   Patient notified that their request is being sent to the clinical staff for review and that they should receive a call once it is complete. If they do not receive a call within 72 hours they can check with their pharmacy or our office.    Pt states she has only 2 pills left

## 2020-05-09 ENCOUNTER — Other Ambulatory Visit: Payer: Self-pay | Admitting: Family Medicine

## 2020-05-09 DIAGNOSIS — E039 Hypothyroidism, unspecified: Secondary | ICD-10-CM

## 2020-05-09 NOTE — Telephone Encounter (Signed)
Requested medication (s) are due for refill today: yes  Requested medication (s) are on the active medication list: yes  Last refill:  02/22/20  Future visit scheduled:No    Notes to clinic:  Last TSH checked 04/02/19    Requested Prescriptions  Pending Prescriptions Disp Refills   levothyroxine (SYNTHROID) 112 MCG tablet [Pharmacy Med Name: LEVOTHYROXINE 112 MCG TABLET] 90 tablet 0    Sig: TAKE 1 TABLET BY MOUTH EVERY DAY      Endocrinology:  Hypothyroid Agents Failed - 05/09/2020 12:06 PM      Failed - TSH needs to be rechecked within 3 months after an abnormal result. Refill until TSH is due.      Failed - TSH in normal range and within 360 days    TSH  Date Value Ref Range Status  04/02/2019 3.860 0.450 - 4.500 uIU/mL Final          Passed - Valid encounter within last 12 months    Recent Outpatient Visits           8 months ago Acute right-sided low back pain without sciatica   [facility], Meda Coffee, MD   11 months ago Dysuria   [facility], Meda Coffee, MD   1 year ago COPD exacerbation [facility])   [facility] at [facility], Minerva Fester, MD   1 year ago Fatigue, unspecified type   [facility], Meda Coffee, MD   1 year ago Hypothyroidism, unspecified type   [facility], Meda Coffee, MD

## 2020-07-07 ENCOUNTER — Ambulatory Visit: Payer: BC Managed Care – PPO | Admitting: Registered Nurse

## 2020-07-07 ENCOUNTER — Encounter: Payer: Self-pay | Admitting: Registered Nurse

## 2020-07-07 ENCOUNTER — Other Ambulatory Visit: Payer: Self-pay

## 2020-07-07 VITALS — BP 104/71 | HR 84 | Temp 98.0°F | Resp 18 | Ht 68.0 in | Wt 165.2 lb

## 2020-07-07 DIAGNOSIS — Z130 Encounter for screening for diseases of the blood and blood-forming organs and certain disorders involving the immune mechanism: Secondary | ICD-10-CM

## 2020-07-07 DIAGNOSIS — Z13220 Encounter for screening for lipoid disorders: Secondary | ICD-10-CM

## 2020-07-07 DIAGNOSIS — Z13228 Encounter for screening for other metabolic disorders: Secondary | ICD-10-CM | POA: Diagnosis not present

## 2020-07-07 DIAGNOSIS — F411 Generalized anxiety disorder: Secondary | ICD-10-CM | POA: Insufficient documentation

## 2020-07-07 DIAGNOSIS — E039 Hypothyroidism, unspecified: Secondary | ICD-10-CM

## 2020-07-07 DIAGNOSIS — Z1329 Encounter for screening for other suspected endocrine disorder: Secondary | ICD-10-CM | POA: Diagnosis not present

## 2020-07-07 DIAGNOSIS — F410 Panic disorder [episodic paroxysmal anxiety] without agoraphobia: Secondary | ICD-10-CM | POA: Insufficient documentation

## 2020-07-07 DIAGNOSIS — Z1159 Encounter for screening for other viral diseases: Secondary | ICD-10-CM | POA: Diagnosis not present

## 2020-07-07 MED ORDER — ALPRAZOLAM 0.25 MG PO TBDP: 0.2500 mg | ORAL_TABLET | Freq: Three times a day (TID) | ORAL | 0 refills | Status: DC | PRN

## 2020-07-07 MED ORDER — BUPROPION HCL ER (SR) 100 MG PO TB12: 100.0000 mg | ORAL_TABLET | Freq: Two times a day (BID) | ORAL | 0 refills | Status: DC

## 2020-07-07 MED ORDER — BUSPIRONE HCL 7.5 MG PO TABS: 7.5000 mg | ORAL_TABLET | Freq: Three times a day (TID) | ORAL | 1 refills | Status: DC

## 2020-07-07 NOTE — Patient Instructions (Signed)
Ms Cayman Brogden to meet you today. In brief, here's the medications we discussed:   Bupropion (Wellbutrin): Please take 1 capsule (100mg  SR) daily for one week in the mornings. Then increase to 1 capsule in the mornings and 1 capsule at nighttime.   Buspirone (BuSpar): Please take 1 tablet (7.5mg ) up to three times daily as needed for anxiety.  Alprazolam (Nivaram/Xanax): Please take 1 sublingual tablet (0.25mg ) up to three times daily as needed for panic attacks. You can take 2 before bed if needed. Please do not exceed 3 doses in a day. If this dose is not sufficient, please call my office.   I'd like to see you in 4-6 weeks for a follow up to monitor therapeutic effect of these medications.  Please keep in mind I'd be happy to refer you to a therapist at any point if you'd like. Just let me know.   Please reach out with any questions, comments, or concerns.

## 2020-07-08 LAB — CBC WITH DIFFERENTIAL
Basophils Absolute: 0.1 10*3/uL (ref 0.0–0.2)
Basos: 1 %
EOS (ABSOLUTE): 0.1 10*3/uL (ref 0.0–0.4)
Eos: 1 %
Hematocrit: 43.3 % (ref 34.0–46.6)
Hemoglobin: 14.7 g/dL (ref 11.1–15.9)
Immature Grans (Abs): 0 10*3/uL (ref 0.0–0.1)
Immature Granulocytes: 0 %
Lymphocytes Absolute: 2.7 10*3/uL (ref 0.7–3.1)
Lymphs: 35 %
MCH: 31.7 pg (ref 26.6–33.0)
MCHC: 33.9 g/dL (ref 31.5–35.7)
MCV: 93 fL (ref 79–97)
Monocytes Absolute: 0.5 10*3/uL (ref 0.1–0.9)
Monocytes: 6 %
Neutrophils Absolute: 4.4 10*3/uL (ref 1.4–7.0)
Neutrophils: 57 %
RBC: 4.64 x10E6/uL (ref 3.77–5.28)
RDW: 12.2 % (ref 11.7–15.4)
WBC: 7.8 10*3/uL (ref 3.4–10.8)

## 2020-07-08 LAB — COMPREHENSIVE METABOLIC PANEL
ALT: 12 IU/L (ref 0–32)
AST: 12 IU/L (ref 0–40)
Albumin/Globulin Ratio: 1.8 (ref 1.2–2.2)
Albumin: 4.4 g/dL (ref 3.8–4.9)
Alkaline Phosphatase: 114 IU/L (ref 48–121)
BUN/Creatinine Ratio: 14 (ref 9–23)
BUN: 13 mg/dL (ref 6–24)
Bilirubin Total: 0.4 mg/dL (ref 0.0–1.2)
CO2: 22 mmol/L (ref 20–29)
Calcium: 9.4 mg/dL (ref 8.7–10.2)
Chloride: 104 mmol/L (ref 96–106)
Creatinine, Ser: 0.9 mg/dL (ref 0.57–1.00)
GFR calc Af Amer: 84 mL/min/{1.73_m2} (ref >59)
GFR calc non Af Amer: 73 mL/min/{1.73_m2} (ref >59)
Globulin, Total: 2.4 g/dL (ref 1.5–4.5)
Glucose: 95 mg/dL (ref 65–99)
Potassium: 3.5 mmol/L (ref 3.5–5.2)
Sodium: 140 mmol/L (ref 134–144)
Total Protein: 6.8 g/dL (ref 6.0–8.5)

## 2020-07-08 LAB — TSH: TSH: 2.39 u[IU]/mL (ref 0.450–4.500)

## 2020-07-08 LAB — LIPID PANEL
Chol/HDL Ratio: 4.2 ratio (ref 0.0–4.4)
Cholesterol, Total: 209 mg/dL — ABNORMAL HIGH (ref 100–199)
HDL: 50 mg/dL (ref >39)
LDL Chol Calc (NIH): 140 mg/dL — ABNORMAL HIGH (ref 0–99)
Triglycerides: 108 mg/dL (ref 0–149)
VLDL Cholesterol Cal: 19 mg/dL (ref 5–40)

## 2020-07-08 LAB — HEPATITIS C ANTIBODY: Hep C Virus Ab: <0.1 s/co ratio (ref 0.0–0.9)

## 2020-07-10 ENCOUNTER — Encounter: Payer: Self-pay | Admitting: Registered Nurse

## 2020-07-10 ENCOUNTER — Telehealth: Payer: Self-pay | Admitting: Registered Nurse

## 2020-07-10 NOTE — Telephone Encounter (Signed)
Please advise on if its ok for the patient to just take the other medication that was prescribed by itself

## 2020-07-10 NOTE — Telephone Encounter (Signed)
Pt called and stated she ha d bad reaction to buPROPion (WELLBUTRIN SR) 100 MG 12 hr tablet  She had blurred vision , dizziness and increased heart rate after taking this medication / pt asked for a call to discuss if she can just take the other Rx prescribed by itself / Pt also asked for a call for her lab results / please advise

## 2020-07-11 NOTE — Telephone Encounter (Signed)
Patient was informed.

## 2020-07-11 NOTE — Telephone Encounter (Signed)
Yes - definitely fine.  She can stop the wellbutrin immediately. No need for taper.  Thank you  Jari Sportsman, NP

## 2020-07-22 ENCOUNTER — Other Ambulatory Visit: Payer: Self-pay | Admitting: Family Medicine

## 2020-07-22 DIAGNOSIS — E039 Hypothyroidism, unspecified: Secondary | ICD-10-CM

## 2020-07-29 ENCOUNTER — Other Ambulatory Visit: Payer: Self-pay | Admitting: Registered Nurse

## 2020-07-29 DIAGNOSIS — F410 Panic disorder [episodic paroxysmal anxiety] without agoraphobia: Secondary | ICD-10-CM

## 2020-07-29 DIAGNOSIS — F411 Generalized anxiety disorder: Secondary | ICD-10-CM

## 2020-08-11 ENCOUNTER — Ambulatory Visit: Payer: BC Managed Care – PPO | Admitting: Registered Nurse

## 2020-08-13 ENCOUNTER — Other Ambulatory Visit: Payer: Self-pay | Admitting: Registered Nurse

## 2020-08-13 DIAGNOSIS — F410 Panic disorder [episodic paroxysmal anxiety] without agoraphobia: Secondary | ICD-10-CM

## 2020-08-13 NOTE — Telephone Encounter (Signed)
Patient is requesting a refill of the following medications: Requested Prescriptions   Pending Prescriptions Disp Refills   buPROPion (WELLBUTRIN SR) 100 MG 12 hr tablet [Pharmacy Med Name: BUPROPION HCL SR 100 MG TABLET] 180 tablet 1    Sig: TAKE 1 TABLET BY MOUTH TWICE A DAY    Date of patient request: 08/12/2020 Last office visit: 07/07/2020 Date of last refill: 07/07/2020 Last refill amount: 90 tablets Follow up time period per chart: 08/22/2020

## 2020-08-22 ENCOUNTER — Ambulatory Visit: Payer: BC Managed Care – PPO | Admitting: Registered Nurse

## 2020-09-22 ENCOUNTER — Encounter: Payer: Self-pay | Admitting: Registered Nurse

## 2020-09-22 NOTE — Progress Notes (Signed)
Established Patient Office Visit  Subjective:  Patient ID: Brenda Johnston, female    DOB: 07/17/1966  Age: 54 y.o. MRN: [id number]  CC:  Chief Complaint  Patient presents with  . Anxiety    patient states she has been having anxiety and deppression issues for over a year, but the last week it has got extremely worse. Per patient she has been having panic attacks and feels like her whole body is shaking. GAD7=21 PHQ9=24    HPI [name] presents for anxiety Has been coping without medication or counseling for over a year - however, acutely worsening Has been having panic attacks with clear physical manifestations of anxiety - shaking, palpitations, rapid breathing. Takes a while to calm herself down   Interested in starting medication. Will consider counseling  Interested in having routine labs collected today.   History reviewed. No pertinent past medical history.  History reviewed. No pertinent surgical history.  Family History  Problem Relation Age of Onset  . Cancer Mother   . Stroke Father   . Diabetes Sister   . Hypertension Brother     Social History   Socioeconomic History  . Marital status: Married    Spouse name: Not on file  . Number of children: Not on file  . Years of education: Not on file  . Highest education level: Not on file  Occupational History  . Not on file  Tobacco Use  . Smoking status: Current Every Day Smoker  . Smokeless tobacco: Never Used  Substance and Sexual Activity  . Alcohol use: No  . Drug use: No  . Sexual activity: Yes  Other Topics Concern  . Not on file  Social History Narrative  . Not on file   Social Determinants of Health   Financial Resource Strain:   . Difficulty of Paying Living Expenses: Not on file  Food Insecurity:   . Worried About Programme researcher, broadcasting/film/video in the Last Year: Not on file  . Ran Out of Food in the Last Year: Not on file  Transportation Needs:   . Lack of Transportation (Medical): Not on file  .  Lack of Transportation (Non-Medical): Not on file  Physical Activity:   . Days of Exercise per Week: Not on file  . Minutes of Exercise per Session: Not on file  Stress:   . Feeling of Stress : Not on file  Social Connections:   . Frequency of Communication with Friends and Family: Not on file  . Frequency of Social Gatherings with Friends and Family: Not on file  . Attends Religious Services: Not on file  . Active Member of Clubs or Organizations: Not on file  . Attends Banker Meetings: Not on file  . Marital Status: Not on file  Intimate Partner Violence:   . Fear of Current or Ex-Partner: Not on file  . Emotionally Abused: Not on file  . Physically Abused: Not on file  . Sexually Abused: Not on file    Outpatient Medications Prior to Visit  Medication Sig Dispense Refill  . omeprazole (PRILOSEC) 20 MG capsule TAKE 1 CAPSULE BY MOUTH EVERY DAY 90 capsule 1  . levothyroxine (SYNTHROID) 112 MCG tablet TAKE 1 TABLET BY MOUTH EVERY DAY 90 tablet 0  . meloxicam (MOBIC) 15 MG tablet TAKE 1 TABLET BY MOUTH EVERY DAY (Patient not taking: Reported on 07/07/2020) 30 tablet 0  . mometasone-formoterol (DULERA) 200-5 MCG/ACT AERO Inhale 2 puffs into the lungs 2 (two) times a day. (  Patient not taking: Reported on 07/07/2020) 1 Inhaler 1  . tiZANidine (ZANAFLEX) 4 MG tablet Take 0.5-1 tablets (2-4 mg total) by mouth every 6 (six) hours as needed for muscle spasms. (Patient not taking: Reported on 07/07/2020) 30 tablet 0   No facility-administered medications prior to visit.    Allergies  Allergen Reactions  . Codeine   . Penicillins     ROS Review of Systems  Constitutional: Negative.   HENT: Negative.   Eyes: Negative.   Respiratory: Negative.   Cardiovascular: Negative.   Gastrointestinal: Negative.   Genitourinary: Negative.   Musculoskeletal: Negative.   Skin: Negative.   Neurological: Negative.   Psychiatric/Behavioral: Positive for sleep disturbance. Negative for  suicidal ideas. The patient is nervous/anxious.       Objective:    Physical Exam Vitals and nursing note reviewed.  Constitutional:      General: She is not in acute distress.    Appearance: Normal appearance. She is normal weight. She is not ill-appearing, toxic-appearing or diaphoretic.  Cardiovascular:     Rate and Rhythm: Normal rate and regular rhythm.     Heart sounds: Normal heart sounds. No murmur heard.  No friction rub. No gallop.   Pulmonary:     Effort: Pulmonary effort is normal. No respiratory distress.     Breath sounds: Normal breath sounds. No stridor. No wheezing, rhonchi or rales.  Chest:     Chest wall: No tenderness.  Skin:    General: Skin is warm and dry.  Neurological:     General: No focal deficit present.     Mental Status: She is alert and oriented to person, place, and time. Mental status is at baseline.  Psychiatric:        Mood and Affect: Mood normal.        Behavior: Behavior normal.        Thought Content: Thought content normal.        Judgment: Judgment normal.     BP 104/71   Pulse 84   Temp 98 F (36.7 C) (Temporal)   Resp 18   Ht 5\' 8"  (1.727 m)   Wt 165 lb 3.2 oz (74.9 kg)   SpO2 93%   BMI 25.12 kg/m  Wt Readings from Last 3 Encounters:  07/07/20 165 lb 3.2 oz (74.9 kg)  08/31/19 170 lb (77.1 kg)  12/02/18 171 lb (77.6 kg)     Health Maintenance Due  Topic Date Due  . COVID-19 Vaccine (1) Never done  . HIV Screening  Never done  . TETANUS/TDAP  Never done  . PAP SMEAR-Modifier  Never done  . INFLUENZA VACCINE  Never done    There are no preventive care reminders to display for this patient.  Lab Results  Component Value Date   TSH 2.390 07/07/2020   Lab Results  Component Value Date   WBC 7.8 07/07/2020   HGB 14.7 07/07/2020   HCT 43.3 07/07/2020   MCV 93 07/07/2020   PLT 270 04/02/2019   Lab Results  Component Value Date   NA 140 07/07/2020   K 3.5 07/07/2020   CO2 22 07/07/2020   GLUCOSE 95  07/07/2020   BUN 13 07/07/2020   CREATININE 0.90 07/07/2020   BILITOT 0.4 07/07/2020   ALKPHOS 114 07/07/2020   AST 12 07/07/2020   ALT 12 07/07/2020   PROT 6.8 07/07/2020   ALBUMIN 4.4 07/07/2020   CALCIUM 9.4 07/07/2020   Lab Results  Component Value Date   CHOL  209 (H) 07/07/2020   Lab Results  Component Value Date   HDL 50 07/07/2020   Lab Results  Component Value Date   LDLCALC 140 (H) 07/07/2020   Lab Results  Component Value Date   TRIG 108 07/07/2020   Lab Results  Component Value Date   CHOLHDL 4.2 07/07/2020   Lab Results  Component Value Date   HGBA1C 5.4 10/06/2018      Assessment & Plan:   Problem List Items Addressed This Visit      Endocrine   Hypothyroidism   Relevant Orders   TSH (Completed)     Other   Generalized anxiety disorder with panic attacks - Primary   Relevant Medications   ALPRAZolam (NIRAVAM) 0.25 MG dissolvable tablet    Other Visit Diagnoses    Lipid screening       Relevant Orders   Lipid panel (Completed)   Need for hepatitis C screening test       Relevant Orders   Hepatitis C Antibody (Completed)   Screening for endocrine, metabolic and immunity disorder       Relevant Orders   CBC With Differential (Completed)   Comprehensive metabolic panel (Completed)   TSH (Completed)   POCT glycosylated hemoglobin (Hb A1C)      Meds ordered this encounter  Medications  . ALPRAZolam (NIRAVAM) 0.25 MG dissolvable tablet    Sig: Take 1 tablet (0.25 mg total) by mouth 3 (three) times daily as needed for anxiety.    Dispense:  30 tablet    Refill:  0    Order Specific Question:   Supervising Provider    Answer:   Neva Seat, JEFFREY R [[id number]]  . DISCONTD: buPROPion (WELLBUTRIN SR) 100 MG 12 hr tablet    Sig: Take 1 tablet (100 mg total) by mouth 2 (two) times daily.    Dispense:  90 tablet    Refill:  0    Order Specific Question:   Supervising Provider    Answer:   Neva Seat, JEFFREY R [[id number]]  . DISCONTD: busPIRone (BUSPAR)  7.5 MG tablet    Sig: Take 1 tablet (7.5 mg total) by mouth 3 (three) times daily.    Dispense:  90 tablet    Refill:  1    Order Specific Question:   Supervising Provider    Answer:   Neva Seat, JEFFREY R [[id number]]    Follow-up: Return in about 5 weeks (around 08/11/2020) for med check w provider - bupropion, buspirone, alprazolam.   PLAN  Start wellbutrin 100mg  12hr PO qd. Increase to twice daily after one week  Start buspar 7.5mg  PO tid.   Alprazolam 0.25mg  sublingual for breakthrough anxiety or panic attacks. May take up to three times daily  Suggest counseling  Med check in 5-6 weeks  Labs collected, will follow up as warranted  Patient encouraged to call clinic with any questions, comments, or concerns.  , NP

## 2020-10-22 ENCOUNTER — Other Ambulatory Visit: Payer: Self-pay | Admitting: Registered Nurse

## 2020-10-22 DIAGNOSIS — E039 Hypothyroidism, unspecified: Secondary | ICD-10-CM

## 2020-11-19 ENCOUNTER — Other Ambulatory Visit: Payer: Self-pay | Admitting: Registered Nurse

## 2020-11-19 DIAGNOSIS — E039 Hypothyroidism, unspecified: Secondary | ICD-10-CM

## 2020-11-19 DIAGNOSIS — F410 Panic disorder [episodic paroxysmal anxiety] without agoraphobia: Secondary | ICD-10-CM

## 2020-11-19 NOTE — Telephone Encounter (Signed)
Requested medication (s) are due for refill today: no  Requested medication (s) are on the active medication list: yes   Last refill:  07/07/2020  Future visit scheduled: no  Notes to clinic:  this refill cannot be delegated  Synthroid is not due as it was just sent for 90 day   Requested Prescriptions  Pending Prescriptions Disp Refills   levothyroxine (SYNTHROID) 112 MCG tablet [Pharmacy Med Name: LEVOTHYROXINE 112 MCG TABLET] 90 tablet 0    Sig: TAKE 1 TABLET BY MOUTH EVERY DAY      Endocrinology:  Hypothyroid Agents Failed - 11/19/2020  6:38 AM      Failed - TSH needs to be rechecked within 3 months after an abnormal result. Refill until TSH is due.      Passed - TSH in normal range and within 360 days    TSH  Date Value Ref Range Status  07/07/2020 2.390 0.450 - 4.500 uIU/mL Final          Passed - Valid encounter within last 12 months    Recent Outpatient Visits           4 months ago Generalized anxiety disorder with panic attacks   [facility], Richard, NP   1 year ago Acute right-sided low back pain without sciatica   [facility], Meda Coffee, MD   1 year ago Dysuria   [facility], Meda Coffee, MD   1 year ago COPD exacerbation [facility])   Primary Care at [facility], Minerva Fester, MD   1 year ago Fatigue, unspecified type   [facility], Meda Coffee, MD                ALPRAZolam (NIRAVAM) 0.25 MG dissolvable tablet [Pharmacy Med Name: ALPRAZOLAM ODT 0.25 MG TAB] 30 tablet 0    Sig: DISSOLVE 1 TABLET ON THE TONGUE THREE TIMES A DAY AS NEEDED FOR ANXIETY      Not Delegated - Psychiatry:  Anxiolytics/Hypnotics Failed - 11/19/2020  6:38 AM      Failed - This refill cannot be delegated      Failed - Urine Drug Screen completed in last 360 days      Passed - Valid encounter within last 6 months    Recent Outpatient Visits           4 months ago Generalized anxiety disorder with panic attacks    [facility], Richard, NP   1 year ago Acute right-sided low back pain without sciatica   [facility], Meda Coffee, MD   1 year ago Dysuria   [facility], Meda Coffee, MD   1 year ago COPD exacerbation [facility])   Primary Care at [facility], Minerva Fester, MD   1 year ago Fatigue, unspecified type   [facility], Meda Coffee, MD

## 2020-11-19 NOTE — Telephone Encounter (Signed)
Patient is requesting a refill of the following medications: Requested Prescriptions   Pending Prescriptions Disp Refills  . levothyroxine (SYNTHROID) 112 MCG tablet [Pharmacy Med Name: LEVOTHYROXINE 112 MCG TABLET] 90 tablet 0    Sig: TAKE 1 TABLET BY MOUTH EVERY DAY  . ALPRAZolam (NIRAVAM) 0.25 MG dissolvable tablet [Pharmacy Med Name: ALPRAZOLAM ODT 0.25 MG TAB] 30 tablet 0    Sig: DISSOLVE 1 TABLET ON THE TONGUE THREE TIMES A DAY AS NEEDED FOR ANXIETY    Date of patient request: 11/19/2020 Last office visit: 07/07/2020 Date of last refill: 07/07/2020 Last refill amount: 30 tablets  Follow up time period per chart: N/A

## 2021-03-05 ENCOUNTER — Other Ambulatory Visit: Payer: Self-pay | Admitting: Registered Nurse

## 2021-03-05 DIAGNOSIS — F410 Panic disorder [episodic paroxysmal anxiety] without agoraphobia: Secondary | ICD-10-CM

## 2021-03-05 NOTE — Telephone Encounter (Signed)
Requested medication (s) are due for refill today - yes  Requested medication (s) are on the active medication list -yes  Future visit scheduled -no  Last refill: 11/19/20  Notes to clinic: Request non delegated Rx  Requested Prescriptions  Pending Prescriptions Disp Refills   ALPRAZolam (NIRAVAM) 0.25 MG dissolvable tablet [Pharmacy Med Name: ALPRAZOLAM ODT 0.25 MG TAB] 30 tablet 0    Sig: DISSOLVE 1 TABLET ON THE TONGUE THREE TIMES A DAY AS NEEDED FOR ANXIETY      Not Delegated - Psychiatry:  Anxiolytics/Hypnotics Failed - 03/05/2021  3:26 PM      Failed - This refill cannot be delegated      Failed - Urine Drug Screen completed in last 360 days      Failed - Valid encounter within last 6 months    Recent Outpatient Visits           8 months ago Generalized anxiety disorder with panic attacks   [facility], Richard, NP   1 year ago Acute right-sided low back pain without sciatica   Primary Care at [facility], Meda Coffee, MD   1 year ago Dysuria   Primary Care at [facility], Meda Coffee, MD   1 year ago COPD exacerbation [facility])   Primary Care at [facility], Minerva Fester, MD   1 year ago Fatigue, unspecified type   Primary Care at [facility], Meda Coffee, MD                    Requested Prescriptions  Pending Prescriptions Disp Refills   ALPRAZolam (NIRAVAM) 0.25 MG dissolvable tablet [Pharmacy Med Name: ALPRAZOLAM ODT 0.25 MG TAB] 30 tablet 0    Sig: DISSOLVE 1 TABLET ON THE TONGUE THREE TIMES A DAY AS NEEDED FOR ANXIETY      Not Delegated - Psychiatry:  Anxiolytics/Hypnotics Failed - 03/05/2021  3:26 PM      Failed - This refill cannot be delegated      Failed - Urine Drug Screen completed in last 360 days      Failed - Valid encounter within last 6 months    Recent Outpatient Visits           8 months ago Generalized anxiety disorder with panic attacks   [facility], Richard, NP   1 year ago Acute  right-sided low back pain without sciatica   [facility] at [facility], Meda Coffee, MD   1 year ago Dysuria   Primary Care at [facility], Meda Coffee, MD   1 year ago COPD exacerbation [facility])   Primary Care at [facility], Minerva Fester, MD   1 year ago Fatigue, unspecified type   Primary Care at [facility], Meda Coffee, MD

## 2021-03-05 NOTE — Telephone Encounter (Signed)
Patient is requesting a refill of the following medications: Requested Prescriptions   Pending Prescriptions Disp Refills   ALPRAZolam (NIRAVAM) 0.25 MG dissolvable tablet [Pharmacy Med Name: ALPRAZOLAM ODT 0.25 MG TAB] 30 tablet 0    Sig: DISSOLVE 1 TABLET ON THE TONGUE THREE TIMES A DAY AS NEEDED FOR ANXIETY    Date of patient request: 03/05/21 Last office visit: 07/07/20 Date of last refill: 11/19/20 Last refill amount: 30 Follow up time period per chart:

## 2021-03-20 ENCOUNTER — Other Ambulatory Visit: Payer: Self-pay | Admitting: Registered Nurse

## 2021-03-20 DIAGNOSIS — F411 Generalized anxiety disorder: Secondary | ICD-10-CM

## 2021-03-20 DIAGNOSIS — F410 Panic disorder [episodic paroxysmal anxiety] without agoraphobia: Secondary | ICD-10-CM

## 2021-03-20 NOTE — Telephone Encounter (Signed)
Requested medication (s) are due for refill today:   Yes  Requested medication (s) are on the active medication list:   Yes  Future visit scheduled:   No   Last ordered: 07/29/2020 #270, 1 refill  Returned because pharmacy requesting a 90 day supply and a DX Code   Protocol failed because does not have a valid encounter within 6 months   Requested Prescriptions  Pending Prescriptions Disp Refills   busPIRone (BUSPAR) 7.5 MG tablet [Pharmacy Med Name: BUSPIRONE HCL 7.5 MG TABLET] 270 tablet 2    Sig: TAKE 1 TABLET BY MOUTH THREE TIMES A DAY      Psychiatry: Anxiolytics/Hypnotics - Non-controlled Failed - 03/20/2021  2:32 PM      Failed - Valid encounter within last 6 months    Recent Outpatient Visits           8 months ago Generalized anxiety disorder with panic attacks   [facility], Richard, NP   1 year ago Acute right-sided low back pain without sciatica   Primary Care at [facility], Meda Coffee, MD   1 year ago Dysuria   Primary Care at [facility], Meda Coffee, MD   1 year ago COPD exacerbation [facility])   Primary Care at [facility], Minerva Fester, MD   1 year ago Fatigue, unspecified type   Primary Care at [facility], Meda Coffee, MD

## 2021-03-20 NOTE — Telephone Encounter (Signed)
Patient is requesting a refill of the following medications: Requested Prescriptions   Pending Prescriptions Disp Refills  . busPIRone (BUSPAR) 7.5 MG tablet [Pharmacy Med Name: BUSPIRONE HCL 7.5 MG TABLET] 270 tablet 2    Sig: TAKE 1 TABLET BY MOUTH THREE TIMES A DAY    Date of patient request: 03/20/2021 Last office visit: 07/07/2020 Date of last refill: 07/29/2020 Last refill amount: 270 1 refill Follow up time period per chart: none

## 2021-04-05 ENCOUNTER — Other Ambulatory Visit: Payer: Self-pay | Admitting: Registered Nurse

## 2021-04-05 DIAGNOSIS — F410 Panic disorder [episodic paroxysmal anxiety] without agoraphobia: Secondary | ICD-10-CM

## 2021-04-05 DIAGNOSIS — F411 Generalized anxiety disorder: Secondary | ICD-10-CM

## 2021-04-05 DIAGNOSIS — E039 Hypothyroidism, unspecified: Secondary | ICD-10-CM

## 2021-04-24 ENCOUNTER — Encounter: Payer: Self-pay | Admitting: Family Medicine

## 2021-04-24 ENCOUNTER — Telehealth (INDEPENDENT_AMBULATORY_CARE_PROVIDER_SITE_OTHER): Payer: BC Managed Care – PPO | Admitting: Family Medicine

## 2021-04-24 DIAGNOSIS — J029 Acute pharyngitis, unspecified: Secondary | ICD-10-CM | POA: Diagnosis not present

## 2021-04-24 DIAGNOSIS — J069 Acute upper respiratory infection, unspecified: Secondary | ICD-10-CM

## 2021-04-24 MED ORDER — BENZONATATE 100 MG PO CAPS: 100.0000 mg | ORAL_CAPSULE | Freq: Three times a day (TID) | ORAL | 0 refills | Status: DC | PRN

## 2021-04-24 NOTE — Patient Instructions (Addendum)
You likely have a cold virus, that can also cause sore throat and your other symptoms.  I am glad to hear that that first COVID test was negative, but I do recommend you repeat that test within the next 36 to 48 hours to make sure.  In the meantime I do recommend wearing a mask at all times around others and try to isolate yourself until you are sure that this is not a COVID infection.  If your test does turn positive for COVID infection,  give [place] a call and we can do another video visit or audio visit to make sure there are no other medications we need to consider.   Okay to continue drinking fluids, rest and medicines like Mucinex and salt water nasal spray may help with the congestion.  Tylenol if needed. Sore throat lozenges like Cepacol over-the-counter can be helpful as well as fluids. Tessalon perles if needed for cough.   If you are having worsening shortness of breath at rest, confusion, or acutely getting worse I do recommend you be seen through an urgent care or emergency room but I do not think that will happen. If sore throat worsens, pus on tonsils, or fevers return I recommend in person evaluation at Urgent care as well. Hope you feel better soon.     Sore Throat A sore throat is pain, burning, irritation, or scratchiness in the throat. When you have a sore throat, you may feel pain or tenderness in your throat when you swallow or talk. Many things can cause a sore throat, including:  An infection.  Seasonal allergies.  Dryness in the air.  Irritants, such as smoke or pollution.  Radiation treatment to the area.  Gastroesophageal reflux disease (GERD).  A tumor. A sore throat is often the first sign of another sickness. It may happen with other symptoms, such as coughing, sneezing, fever, and swollen neck glands. Most sore throats go away without medical treatment. Follow these instructions at home:  Take over-the-counter medicines only as told by your health care  provider. ? If your child has a sore throat, do not give your child aspirin because of the association with Reye syndrome.  Drink enough fluids to keep your urine pale yellow.  Rest as needed.  To help with pain, try: ? Sipping warm liquids, such as broth, herbal tea, or warm water. ? Eating or drinking cold or frozen liquids, such as frozen ice pops. ? Gargling with a salt-water mixture 3-4 times a day or as needed. To make a salt-water mixture, completely dissolve -1 tsp (3-6 g) of salt in 1 cup (237 mL) of warm water. ? Sucking on hard candy or throat lozenges. ? Putting a cool-mist humidifier in your bedroom at night to moisten the air. ? Sitting in the bathroom with the door closed for 5-10 minutes while you run hot water in the shower.  Do not use any products that contain nicotine or tobacco, such as cigarettes, e-cigarettes, and chewing tobacco. If you need help quitting, ask your health care provider.  Wash your hands well and often with soap and water. If soap and water are not available, use hand sanitizer.      Contact a health care provider if:  You have a fever for more than 2-3 days.  You have symptoms that last (are persistent) for more than 2-3 days.  Your throat does not get better within 7 days.  You have a fever and your symptoms suddenly get worse.  Your child who is 3 months to 79 years old has a temperature of 102.57F (39C) or higher. Get help right away if:  You have difficulty breathing.  You cannot swallow fluids, soft foods, or your saliva.  You have increased swelling in your throat or neck.  You have persistent nausea and vomiting. Summary  A sore throat is pain, burning, irritation, or scratchiness in the throat. Many things can cause a sore throat.  Take over-the-counter medicines only as told by your health care provider. Do not give your child aspirin.  Drink plenty of fluids, and rest as needed.  Contact a health care provider if your  symptoms worsen or your sore throat does not get better within 7 days. This information is not intended to replace advice given to you by your health care provider. Make sure you discuss any questions you have with your health care provider. Document Revised: 05/15/2018 Document Reviewed: 05/15/2018 Elsevier Patient Education  2021 [employer]  Upper Respiratory Infection, Adult An upper respiratory infection (URI) is a common viral infection of the nose, throat, and upper air passages that lead to the lungs. The most common type of URI is the common cold. URIs usually get better on their own, without medical treatment. What are the causes? A URI is caused by a virus. You may catch a virus by:  Breathing in droplets from an infected person's cough or sneeze.  Touching something that has been exposed to the virus (contaminated) and then touching your mouth, nose, or eyes. What increases the risk? You are more likely to get a URI if:  You are very young or very old.  It is autumn or winter.  You have close contact with others, such as at a daycare, school, or health care facility.  You smoke.  You have long-term (chronic) heart or lung disease.  You have a weakened disease-fighting (immune) system.  You have nasal allergies or asthma.  You are experiencing a lot of stress.  You work in an area that has poor air circulation.  You have poor nutrition. What are the signs or symptoms? A URI usually involves some of the following symptoms:  Runny or stuffy (congested) nose.  Sneezing.  Cough.  Sore throat.  Headache.  Fatigue.  Fever.  Loss of appetite.  Pain in your forehead, behind your eyes, and over your cheekbones (sinus pain).  Muscle aches.  Redness or irritation of the eyes.  Pressure in the ears or face. How is this diagnosed? This condition may be diagnosed based on your medical history and symptoms, and a physical exam. Your health care provider  may use a cotton swab to take a mucus sample from your nose (nasal swab). This sample can be tested to determine what virus is causing the illness. How is this treated? URIs usually get better on their own within 7-10 days. You can take steps at home to relieve your symptoms. Medicines cannot cure URIs, but your health care provider may recommend certain medicines to help relieve symptoms, such as:  Over-the-counter cold medicines.  Cough suppressants. Coughing is a type of defense against infection that helps to clear the respiratory system, so take these medicines only as recommended by your health care provider.  Fever-reducing medicines. Follow these instructions at home: Activity  Rest as needed.  If you have a fever, stay home from work or school until your fever is gone or until your health care provider says you are no longer contagious. Your health  care provider may have you wear a face mask to prevent your infection from spreading. Relieving symptoms  Gargle with a salt-water mixture 3-4 times a day or as needed. To make a salt-water mixture, completely dissolve -1 tsp of salt in 1 cup of warm water.  Use a cool-mist humidifier to add moisture to the air. This can help you breathe more easily. Eating and drinking  Drink enough fluid to keep your urine pale yellow.  Eat soups and other clear broths.   General instructions  Take over-the-counter and prescription medicines only as told by your health care provider. These include cold medicines, fever reducers, and cough suppressants.  Do not use any products that contain nicotine or tobacco, such as cigarettes and e-cigarettes. If you need help quitting, ask your health care provider.  Stay away from secondhand smoke.  Stay up to date on all immunizations, including the yearly (annual) flu vaccine.  Keep all follow-up visits as told by your health care provider. This is important.   How to prevent the spread of infection  to others  URIs can be passed from person to person (are contagious). To prevent the infection from spreading: ? Wash your hands often with soap and water. If soap and water are not available, use hand sanitizer. ? Avoid touching your mouth, face, eyes, or nose. ? Cough or sneeze into a tissue or your sleeve or elbow instead of into your hand or into the air.   Contact a health care provider if:  You are getting worse instead of better.  You have a fever or chills.  Your mucus is brown or red.  You have yellow or brown discharge coming from your nose.  You have pain in your face, especially when you bend forward.  You have swollen neck glands.  You have pain while swallowing.  You have white areas in the back of your throat. Get help right away if:  You have shortness of breath that gets worse.  You have severe or persistent: ? Headache. ? Ear pain. ? Sinus pain. ? Chest pain.  You have chronic lung disease along with any of the following: ? Wheezing. ? Prolonged cough. ? Coughing up blood. ? A change in your usual mucus.  You have a stiff neck.  You have changes in your: ? Vision. ? Hearing. ? Thinking. ? Mood. Summary  An upper respiratory infection (URI) is a common infection of the nose, throat, and upper air passages that lead to the lungs.  A URI is caused by a virus.  URIs usually get better on their own within 7-10 days.  Medicines cannot cure URIs, but your health care provider may recommend certain medicines to help relieve symptoms. This information is not intended to replace advice given to you by your health care provider. Make sure you discuss any questions you have with your health care provider. Document Revised: 08/21/2020 Document Reviewed: 08/21/2020 Elsevier Patient Education  2021 [employer].

## 2021-04-24 NOTE — Progress Notes (Signed)
Virtual Visit via audio Note  I connected with Brenda Johnston on 04/24/21 at 10:22 AM by audio - she is unable to connect on her phone. verified that I am speaking with the correct person using two identifiers.  Patient location: home  My location: office - [place]   I discussed the limitations, risks, security and privacy concerns of performing an evaluation and management service by telephone and the availability of in person appointments. I also discussed with the patient that there may be a patient responsible charge related to this service. The patient expressed understanding and agreed to proceed, consent obtained  Chief complaint:  Chief Complaint  Patient presents with  . Fever    Pt had fever yesterday does not belevie she does today, has congestion in her sinuses, sore throat when swallowing ears feel full.     History of Present Illness: Brenda Johnston is a 55 y.o. female  Fever: Noted yesterday. Woke up with night sweat last night. Felt feverish - subjective while camping. Home now - no measured temp. Feels like fever has resolved.  Some congestion in sinuses, sore throat, fullness in ears. Min dizziness. Min nausea, no vomiting.  Sore throat is worst part. Sick contacts: other camper with cold symptoms/sinus issue.  covid vaccine: only received initial pfizer vaccine (1 vaccine 8 months ago).  Negative covid test this morning - rapid test. Not usually allergy sufferer.  No dyspnea, wheezing. Min cough last night. Postnasal drip.  Tx: none.  Able to drink fluids - feels better.    Patient Active Problem List   Diagnosis Date Noted  . Generalized anxiety disorder with panic attacks 07/07/2020  . COPD exacerbation (HCC) 04/27/2019  . Hypothyroidism 03/30/2019  . Gastroesophageal reflux disease 03/30/2019   No past medical history on file. No past surgical history on file. Allergies  Allergen Reactions  . Codeine   . Penicillins    Prior to Admission medications    Medication Sig Start Date End Date Taking? Authorizing Provider  ALPRAZolam (NIRAVAM) 0.25 MG dissolvable tablet DISSOLVE 1 TABLET ON THE TONGUE THREE TIMES A DAY AS NEEDED FOR ANXIETY 04/06/21  Yes Janeece AgeeMorrow, Richard, NP  buPROPion [facility](WELLBUTRIN SR) 100 MG 12 hr tablet TAKE 1 TABLET BY MOUTH TWICE A DAY 08/13/20  Yes Janeece AgeeMorrow, Richard, NP  busPIRone (BUSPAR) 7.5 MG tablet TAKE 1 TABLET BY MOUTH THREE TIMES A DAY 03/20/21  Yes Janeece AgeeMorrow, Richard, NP  levothyroxine (SYNTHROID) 112 MCG tablet TAKE 1 TABLET BY MOUTH EVERY DAY 04/06/21  Yes Janeece AgeeMorrow, Richard, NP  omeprazole (PRILOSEC) 20 MG capsule TAKE 1 CAPSULE BY MOUTH EVERY DAY 06/21/19  Yes Lezlie LyeSantiago Lago, Meda CoffeeIrma M, MD  meloxicam (MOBIC) 15 MG tablet TAKE 1 TABLET BY MOUTH EVERY DAY Patient not taking: No sig reported 09/23/19   Lezlie LyeSantiago Lago, Meda CoffeeIrma M, MD  mometasone-formoterol [facility](DULERA) 200-5 MCG/ACT AERO Inhale 2 puffs into the lungs 2 (two) times a day. Patient not taking: No sig reported 04/27/19   Wandra Feinsteinorum, Lisa L, MD  tiZANidine (ZANAFLEX) 4 MG tablet Take 0.5-1 tablets (2-4 mg total) by mouth every 6 (six) hours as needed for muscle spasms. Patient not taking: No sig reported 08/31/19   Lezlie LyeSantiago Lago, Meda CoffeeIrma M, MD   Social History   Socioeconomic History  . Marital status: Married    Spouse name: Not on file  . Number of children: Not on file  . Years of education: Not on file  . Highest education level: Not on file  Occupational History  . Not on  file  Tobacco Use  . Smoking status: Current Every Day Smoker  . Smokeless tobacco: Never Used  Substance and Sexual Activity  . Alcohol use: No  . Drug use: No  . Sexual activity: Yes  Other Topics Concern  . Not on file  Social History Narrative  . Not on file   Social Determinants of Health   Financial Resource Strain: Not on file  Food Insecurity: Not on file  Transportation Needs: Not on file  Physical Activity: Not on file  Stress: Not on file  Social Connections: Not on file  Intimate Partner  Violence: Not on file    Observations/Objective: There were no vitals filed for this visit. No apparent distress on phone.  Speaking in full sentences without respiratory distress, no cough during visit.  Coherent responses, all questions were answered with understanding of plan expressed.  Assessment and Plan: Sore throat  Upper respiratory tract infection, unspecified type - Plan: benzonatate (TESSALON) 100 MG capsule Suspected viral URI, less likely strep throat given congestion and other symptoms.  Fever/subjective fever has resolved today.  Differential includes COVID infection but reassuring initial negative rapid test.  Recommended continued isolation, mask use until repeat negative test in 36 to 48 hours.  -Tessalon Perles as needed for cough, symptomatic care was discussed with sore throat including cold or warm fluids, Tylenol, salt water gargles, honey.  -Mucinex if needed for congestion, saline nasal spray.  -Urgent care/ER precautions given.  -With single mRNA vaccine, not fully vaccinated.  If she does have a positive COVID test would consider COVID treatments and advised to call right away for visit.  Follow Up Instructions: As needed, urgent care/ER precautions.  Patient Instructions   You likely have a cold virus, that can also cause sore throat and your other symptoms.  I am glad to hear that that first COVID test was negative, but I do recommend you repeat that test within the next 36 to 48 hours to make sure.  In the meantime I do recommend wearing a mask at all times around others and try to isolate yourself until you are sure that this is not a COVID infection.  If your test does turn positive for COVID infection,  give [place] a call and we can do another video visit or audio visit to make sure there are no other medications we need to consider.   Okay to continue drinking fluids, rest and medicines like Mucinex and salt water nasal spray may help with the congestion.   Tylenol if needed. Sore throat lozenges like Cepacol over-the-counter can be helpful as well as fluids. Tessalon perles if needed for cough.   If you are having worsening shortness of breath at rest, confusion, or acutely getting worse I do recommend you be seen through an urgent care or emergency room but I do not think that will happen. If sore throat worsens, pus on tonsils, or fevers return I recommend in person evaluation at Urgent care as well. Hope you feel better soon.     Sore Throat A sore throat is pain, burning, irritation, or scratchiness in the throat. When you have a sore throat, you may feel pain or tenderness in your throat when you swallow or talk. Many things can cause a sore throat, including:  An infection.  Seasonal allergies.  Dryness in the air.  Irritants, such as smoke or pollution.  Radiation treatment to the area.  Gastroesophageal reflux disease (GERD).  A tumor. A sore throat is often  the first sign of another sickness. It may happen with other symptoms, such as coughing, sneezing, fever, and swollen neck glands. Most sore throats go away without medical treatment. Follow these instructions at home:  Take over-the-counter medicines only as told by your health care provider. ? If your child has a sore throat, do not give your child aspirin because of the association with Reye syndrome.  Drink enough fluids to keep your urine pale yellow.  Rest as needed.  To help with pain, try: ? Sipping warm liquids, such as broth, herbal tea, or warm water. ? Eating or drinking cold or frozen liquids, such as frozen ice pops. ? Gargling with a salt-water mixture 3-4 times a day or as needed. To make a salt-water mixture, completely dissolve -1 tsp (3-6 g) of salt in 1 cup (237 mL) of warm water. ? Sucking on hard candy or throat lozenges. ? Putting a cool-mist humidifier in your bedroom at night to moisten the air. ? Sitting in the bathroom with the door closed  for 5-10 minutes while you run hot water in the shower.  Do not use any products that contain nicotine or tobacco, such as cigarettes, e-cigarettes, and chewing tobacco. If you need help quitting, ask your health care provider.  Wash your hands well and often with soap and water. If soap and water are not available, use hand sanitizer.      Contact a health care provider if:  You have a fever for more than 2-3 days.  You have symptoms that last (are persistent) for more than 2-3 days.  Your throat does not get better within 7 days.  You have a fever and your symptoms suddenly get worse.  Your child who is 3 months to 57 years old has a temperature of 102.32F (39C) or higher. Get help right away if:  You have difficulty breathing.  You cannot swallow fluids, soft foods, or your saliva.  You have increased swelling in your throat or neck.  You have persistent nausea and vomiting. Summary  A sore throat is pain, burning, irritation, or scratchiness in the throat. Many things can cause a sore throat.  Take over-the-counter medicines only as told by your health care provider. Do not give your child aspirin.  Drink plenty of fluids, and rest as needed.  Contact a health care provider if your symptoms worsen or your sore throat does not get better within 7 days. This information is not intended to replace advice given to you by your health care provider. Make sure you discuss any questions you have with your health care provider. Document Revised: 05/15/2018 Document Reviewed: 05/15/2018 Elsevier Patient Education  2021 Elsevier Inc.  Upper Respiratory Infection, Adult An upper respiratory infection (URI) is a common viral infection of the nose, throat, and upper air passages that lead to the lungs. The most common type of URI is the common cold. URIs usually get better on their own, without medical treatment. What are the causes? A URI is caused by a virus. You may catch a virus  by:  Breathing in droplets from an infected person's cough or sneeze.  Touching something that has been exposed to the virus (contaminated) and then touching your mouth, nose, or eyes. What increases the risk? You are more likely to get a URI if:  You are very young or very old.  It is autumn or winter.  You have close contact with others, such as at a daycare, school, or health care facility.  You smoke.  You have long-term (chronic) heart or lung disease.  You have a weakened disease-fighting (immune) system.  You have nasal allergies or asthma.  You are experiencing a lot of stress.  You work in an area that has poor air circulation.  You have poor nutrition. What are the signs or symptoms? A URI usually involves some of the following symptoms:  Runny or stuffy (congested) nose.  Sneezing.  Cough.  Sore throat.  Headache.  Fatigue.  Fever.  Loss of appetite.  Pain in your forehead, behind your eyes, and over your cheekbones (sinus pain).  Muscle aches.  Redness or irritation of the eyes.  Pressure in the ears or face. How is this diagnosed? This condition may be diagnosed based on your medical history and symptoms, and a physical exam. Your health care provider may use a cotton swab to take a mucus sample from your nose (nasal swab). This sample can be tested to determine what virus is causing the illness. How is this treated? URIs usually get better on their own within 7-10 days. You can take steps at home to relieve your symptoms. Medicines cannot cure URIs, but your health care provider may recommend certain medicines to help relieve symptoms, such as:  Over-the-counter cold medicines.  Cough suppressants. Coughing is a type of defense against infection that helps to clear the respiratory system, so take these medicines only as recommended by your health care provider.  Fever-reducing medicines. Follow these instructions at home: Activity  Rest  as needed.  If you have a fever, stay home from work or school until your fever is gone or until your health care provider says you are no longer contagious. Your health care provider may have you wear a face mask to prevent your infection from spreading. Relieving symptoms  Gargle with a salt-water mixture 3-4 times a day or as needed. To make a salt-water mixture, completely dissolve -1 tsp of salt in 1 cup of warm water.  Use a cool-mist humidifier to add moisture to the air. This can help you breathe more easily. Eating and drinking  Drink enough fluid to keep your urine pale yellow.  Eat soups and other clear broths.   General instructions  Take over-the-counter and prescription medicines only as told by your health care provider. These include cold medicines, fever reducers, and cough suppressants.  Do not use any products that contain nicotine or tobacco, such as cigarettes and e-cigarettes. If you need help quitting, ask your health care provider.  Stay away from secondhand smoke.  Stay up to date on all immunizations, including the yearly (annual) flu vaccine.  Keep all follow-up visits as told by your health care provider. This is important.   How to prevent the spread of infection to others  URIs can be passed from person to person (are contagious). To prevent the infection from spreading: ? Wash your hands often with soap and water. If soap and water are not available, use hand sanitizer. ? Avoid touching your mouth, face, eyes, or nose. ? Cough or sneeze into a tissue or your sleeve or elbow instead of into your hand or into the air.   Contact a health care provider if:  You are getting worse instead of better.  You have a fever or chills.  Your mucus is brown or red.  You have yellow or brown discharge coming from your nose.  You have pain in your face, especially when you bend forward.  You have swollen  neck glands.  You have pain while swallowing.  You  have white areas in the back of your throat. Get help right away if:  You have shortness of breath that gets worse.  You have severe or persistent: ? Headache. ? Ear pain. ? Sinus pain. ? Chest pain.  You have chronic lung disease along with any of the following: ? Wheezing. ? Prolonged cough. ? Coughing up blood. ? A change in your usual mucus.  You have a stiff neck.  You have changes in your: ? Vision. ? Hearing. ? Thinking. ? Mood. Summary  An upper respiratory infection (URI) is a common infection of the nose, throat, and upper air passages that lead to the lungs.  A URI is caused by a virus.  URIs usually get better on their own within 7-10 days.  Medicines cannot cure URIs, but your health care provider may recommend certain medicines to help relieve symptoms. This information is not intended to replace advice given to you by your health care provider. Make sure you discuss any questions you have with your health care provider. Document Revised: 08/21/2020 Document Reviewed: 08/21/2020 Elsevier Patient Education  2021 Elsevier Inc.           I discussed the assessment and treatment plan with the patient. The patient was provided an opportunity to ask questions and all were answered. The patient agreed with the plan and demonstrated an understanding of the instructions.   The patient was advised to call back or seek an in-person evaluation if the symptoms worsen or if the condition fails to improve as anticipated.  I provided 21 minutes of non-face-to-face time during this encounter.   Shade Flood, MD

## 2021-04-27 DIAGNOSIS — J069 Acute upper respiratory infection, unspecified: Secondary | ICD-10-CM | POA: Diagnosis not present

## 2021-04-27 DIAGNOSIS — J209 Acute bronchitis, unspecified: Secondary | ICD-10-CM | POA: Diagnosis not present

## 2021-06-19 ENCOUNTER — Other Ambulatory Visit: Payer: Self-pay | Admitting: Registered Nurse

## 2021-06-19 DIAGNOSIS — F410 Panic disorder [episodic paroxysmal anxiety] without agoraphobia: Secondary | ICD-10-CM

## 2021-07-01 DIAGNOSIS — N3000 Acute cystitis without hematuria: Secondary | ICD-10-CM | POA: Diagnosis not present

## 2021-07-04 ENCOUNTER — Other Ambulatory Visit: Payer: Self-pay | Admitting: Registered Nurse

## 2021-07-04 DIAGNOSIS — E039 Hypothyroidism, unspecified: Secondary | ICD-10-CM

## 2021-07-05 DIAGNOSIS — N390 Urinary tract infection, site not specified: Secondary | ICD-10-CM | POA: Diagnosis not present

## 2021-07-06 ENCOUNTER — Emergency Department (HOSPITAL_COMMUNITY)
Admission: EM | Admit: 2021-07-06 | Discharge: 2021-07-06 | Disposition: A | Discharge status: Home / Self Care | Payer: BC Managed Care – PPO | Attending: Emergency Medicine | Admitting: Emergency Medicine

## 2021-07-06 ENCOUNTER — Emergency Department (HOSPITAL_COMMUNITY): Payer: BC Managed Care – PPO

## 2021-07-06 ENCOUNTER — Other Ambulatory Visit: Payer: Self-pay

## 2021-07-06 ENCOUNTER — Telehealth: Payer: Self-pay | Admitting: *Deleted

## 2021-07-06 DIAGNOSIS — N132 Hydronephrosis with renal and ureteral calculous obstruction: Secondary | ICD-10-CM | POA: Diagnosis not present

## 2021-07-06 DIAGNOSIS — N201 Calculus of ureter: Secondary | ICD-10-CM | POA: Diagnosis not present

## 2021-07-06 DIAGNOSIS — R1084 Generalized abdominal pain: Secondary | ICD-10-CM | POA: Diagnosis not present

## 2021-07-06 DIAGNOSIS — Z79899 Other long term (current) drug therapy: Secondary | ICD-10-CM | POA: Insufficient documentation

## 2021-07-06 DIAGNOSIS — F17210 Nicotine dependence, cigarettes, uncomplicated: Secondary | ICD-10-CM | POA: Diagnosis not present

## 2021-07-06 DIAGNOSIS — R6883 Chills (without fever): Secondary | ICD-10-CM | POA: Diagnosis not present

## 2021-07-06 DIAGNOSIS — E039 Hypothyroidism, unspecified: Secondary | ICD-10-CM | POA: Diagnosis not present

## 2021-07-06 DIAGNOSIS — M419 Scoliosis, unspecified: Secondary | ICD-10-CM | POA: Diagnosis not present

## 2021-07-06 DIAGNOSIS — N200 Calculus of kidney: Secondary | ICD-10-CM

## 2021-07-06 DIAGNOSIS — I700 Atherosclerosis of aorta: Secondary | ICD-10-CM | POA: Diagnosis not present

## 2021-07-06 LAB — BASIC METABOLIC PANEL
Anion gap: 11 (ref 5–15)
BUN: 11 mg/dL (ref 6–20)
CO2: 23 mmol/L (ref 22–32)
Calcium: 9.8 mg/dL (ref 8.9–10.3)
Chloride: 103 mmol/L (ref 98–111)
Creatinine, Ser: 0.86 mg/dL (ref 0.44–1.00)
GFR, Estimated: >60 mL/min (ref >60)
Glucose, Bld: 126 mg/dL — ABNORMAL HIGH (ref 70–99)
Potassium: 3.3 mmol/L — ABNORMAL LOW (ref 3.5–5.1)
Sodium: 137 mmol/L (ref 135–145)

## 2021-07-06 LAB — CBC WITH DIFFERENTIAL/PLATELET
Abs Immature Granulocytes: 0.02 10*3/uL (ref 0.00–0.07)
Basophils Absolute: 0.1 10*3/uL (ref 0.0–0.1)
Basophils Relative: 1 %
Eosinophils Absolute: 0.1 10*3/uL (ref 0.0–0.5)
Eosinophils Relative: 2 %
HCT: 46 % (ref 36.0–46.0)
Hemoglobin: 15.6 g/dL — ABNORMAL HIGH (ref 12.0–15.0)
Immature Granulocytes: 0 %
Lymphocytes Relative: 36 %
Lymphs Abs: 2.6 10*3/uL (ref 0.7–4.0)
MCH: 32.2 pg (ref 26.0–34.0)
MCHC: 33.9 g/dL (ref 30.0–36.0)
MCV: 94.8 fL (ref 80.0–100.0)
Monocytes Absolute: 0.5 10*3/uL (ref 0.1–1.0)
Monocytes Relative: 6 %
Neutro Abs: 4 10*3/uL (ref 1.7–7.7)
Neutrophils Relative %: 55 %
Platelets: 230 10*3/uL (ref 150–400)
RBC: 4.85 MIL/uL (ref 3.87–5.11)
RDW: 12.3 % (ref 11.5–15.5)
WBC: 7.2 10*3/uL (ref 4.0–10.5)
nRBC: 0 % (ref 0.0–0.2)

## 2021-07-06 LAB — URINALYSIS, ROUTINE W REFLEX MICROSCOPIC
Bilirubin Urine: NEGATIVE
Glucose, UA: NEGATIVE mg/dL
Ketones, ur: NEGATIVE mg/dL
Nitrite: NEGATIVE
Protein, ur: NEGATIVE mg/dL
Specific Gravity, Urine: 1.016 (ref 1.005–1.030)
pH: 5 (ref 5.0–8.0)

## 2021-07-06 MED ORDER — HYDROCODONE-ACETAMINOPHEN 5-325 MG PO TABS: 1.0000 | ORAL_TABLET | Freq: Four times a day (QID) | ORAL | 0 refills | Status: AC | PRN

## 2021-07-06 MED ORDER — ONDANSETRON HCL 4 MG PO TABS: 4.0000 mg | ORAL_TABLET | Freq: Three times a day (TID) | ORAL | 0 refills | Status: DC | PRN

## 2021-07-06 MED ORDER — LEVOFLOXACIN 250 MG PO TABS: 250.0000 mg | ORAL_TABLET | Freq: Every day | ORAL | 0 refills | Status: AC

## 2021-07-06 MED ORDER — HYDROCODONE-ACETAMINOPHEN 5-325 MG PO TABS
1.0000 | ORAL_TABLET | Freq: Once | ORAL | Status: AC
Start: 2021-07-06 — End: 2021-07-06
  Administered 2021-07-06: 1 via ORAL
  Filled 2021-07-06: qty 1

## 2021-07-06 MED ORDER — TAMSULOSIN HCL 0.4 MG PO CAPS: 0.4000 mg | ORAL_CAPSULE | Freq: Every day | ORAL | 0 refills | Status: DC

## 2021-07-06 MED ORDER — NAPROXEN 250 MG PO TABS
375.0000 mg | ORAL_TABLET | Freq: Once | ORAL | Status: AC
  Administered 2021-07-06: 375 mg via ORAL
  Filled 2021-07-06: qty 2

## 2021-07-06 NOTE — ED Triage Notes (Signed)
Pt c/o ongoing urinary symptoms onset last Wednesday has been seen by PCP. Has been taking AZO and ABX without relief. C/o right side flank pain, anuria, and chills/ Denies abdominal pain. VS WDL.

## 2021-07-06 NOTE — ED Provider Notes (Signed)
[facility] EMERGENCY DEPARTMENT Provider Note   CSN: [id number] Arrival date & time: 07/06/21  5170     History Chief Complaint  Patient presents with   Urinary Tract Infection    Brenda Johnston is a 55 y.o. female.  HPI  55 year old female with past medical history of COPD presents the emergency department for right flank pain and urinary frequency.  Patient states this started a couple weeks ago with the right flank pain.  She was seen by her PCP and diagnosed with a UTI.  She completed a course of Macrobid without improvement she is now currently on a course of Levaquin.  She states she is now having lower groin pain and dysuria/frequency.  No noted hematuria.  Denies any fever or chills.  No history of kidney stones.  No past medical history on file.  Patient Active Problem List   Diagnosis Date Noted   Generalized anxiety disorder with panic attacks 07/07/2020   COPD exacerbation (HCC) 04/27/2019   Hypothyroidism 03/30/2019   Gastroesophageal reflux disease 03/30/2019    No past surgical history on file.   OB History   No obstetric history on file.     Family History  Problem Relation Age of Onset   Cancer Mother    Stroke Father    Diabetes Sister    Hypertension Brother     Social History   Tobacco Use   Smoking status: Every Day    Pack years: 0.00   Smokeless tobacco: Never  Substance Use Topics   Alcohol use: No   Drug use: No    Home Medications Prior to Admission medications   Medication Sig Start Date End Date Taking? Authorizing Provider  ALPRAZolam (NIRAVAM) 0.25 MG dissolvable tablet DISSOLVE 1 TABLET ON THE TONGUE THREE TIMES A DAY AS NEEDED FOR ANXIETY Patient taking differently: Take 0.25 mg by mouth 3 (three) times daily as needed for anxiety. 06/19/21   Janeece Agee, NP  benzonatate (TESSALON) 100 MG capsule Take 1 capsule (100 mg total) by mouth 3 (three) times daily as needed for cough. 04/24/21   Shade Flood, MD  buPROPion (WELLBUTRIN SR) 100 MG 12 hr tablet TAKE 1 TABLET BY MOUTH TWICE A DAY Patient taking differently: Take 100 mg by mouth 2 (two) times daily. 08/13/20   Janeece Agee, NP  busPIRone (BUSPAR) 7.5 MG tablet TAKE 1 TABLET BY MOUTH THREE TIMES A DAY Patient taking differently: Take 7.5 mg by mouth 3 (three) times daily. 03/20/21   Janeece Agee, NP  levothyroxine (SYNTHROID) 112 MCG tablet TAKE 1 TABLET BY MOUTH EVERY DAY Patient taking differently: Take 112 mcg by mouth daily before breakfast. 04/06/21   Janeece Agee, NP  meloxicam (MOBIC) 15 MG tablet TAKE 1 TABLET BY MOUTH EVERY DAY Patient not taking: No sig reported 09/23/19   Lezlie Lye, Meda Coffee, MD  mometasone-formoterol [facility]) 200-5 MCG/ACT AERO Inhale 2 puffs into the lungs 2 (two) times a day. Patient not taking: No sig reported 04/27/19   Wandra Feinstein, MD  omeprazole (PRILOSEC) 20 MG capsule TAKE 1 CAPSULE BY MOUTH EVERY DAY Patient taking differently: Take 20 mg by mouth daily. 06/21/19   Lezlie Lye, Meda Coffee, MD  tiZANidine (ZANAFLEX) 4 MG tablet Take 0.5-1 tablets (2-4 mg total) by mouth every 6 (six) hours as needed for muscle spasms. Patient not taking: No sig reported 08/31/19   Lezlie Lye, Meda Coffee, MD    Allergies    Codeine and Penicillins  Review of Systems   Review of Systems  Constitutional:  Negative for chills and fever.  HENT:  Negative for congestion.   Eyes:  Negative for visual disturbance.  Respiratory:  Negative for shortness of breath.   Cardiovascular:  Negative for chest pain.  Gastrointestinal:  Negative for abdominal pain, diarrhea and vomiting.  Genitourinary:  Positive for dysuria, flank pain, frequency and pelvic pain. Negative for hematuria and vaginal bleeding.  Skin:  Negative for rash.  Neurological:  Negative for headaches.   Physical Exam Updated Vital Signs BP 131/83 (BP Location: Right Arm)   Pulse 91   Temp 97.8 F (36.6 C) (Oral)   Resp 18   Ht 5\' 8"  (1.727 m)    Wt 70.3 kg   SpO2 99%   BMI 23.57 kg/m   Physical Exam Vitals and nursing note reviewed.  Constitutional:      Appearance: Normal appearance.  HENT:     Head: Normocephalic.     Mouth/Throat:     Mouth: Mucous membranes are moist.  Cardiovascular:     Rate and Rhythm: Normal rate.  Pulmonary:     Effort: Pulmonary effort is normal. No respiratory distress.  Abdominal:     Palpations: Abdomen is soft.     Comments: Mild tenderness to palpation of the right lower quadrant without any guarding  Skin:    General: Skin is warm.  Neurological:     Mental Status: She is alert and oriented to person, place, and time. Mental status is at baseline.  Psychiatric:        Mood and Affect: Mood normal.    ED Results / Procedures / Treatments   Labs (all labs ordered are listed, but only abnormal results are displayed) Labs Reviewed  CBC WITH DIFFERENTIAL/PLATELET - Abnormal; Notable for the following components:      Result Value   Hemoglobin 15.6 (*)    All other components within normal limits  BASIC METABOLIC PANEL - Abnormal; Notable for the following components:   Potassium 3.3 (*)    Glucose, Bld 126 (*)    All other components within normal limits  URINALYSIS, ROUTINE W REFLEX MICROSCOPIC - Abnormal; Notable for the following components:   APPearance HAZY (*)    Hgb urine dipstick MODERATE (*)    Leukocytes,Ua SMALL (*)    Bacteria, UA RARE (*)    All other components within normal limits  URINE CULTURE    EKG None  Radiology CT Abdomen Pelvis Wo Contrast  Result Date: 07/06/2021 CLINICAL DATA:  Right-sided flank pain, lower abdominal pain, chills. EXAM: CT ABDOMEN AND PELVIS WITHOUT CONTRAST TECHNIQUE: Multidetector CT imaging of the abdomen and pelvis was performed following the standard protocol without IV contrast. COMPARISON:  None. FINDINGS: Lower chest: Lung bases are clear. Heart size normal. No pericardial or pleural effusion. Prepericardiac lymph node is  subcentimeter in short axis size. Distal esophagus is grossly unremarkable. Hepatobiliary: Liver and gallbladder are unremarkable. No biliary ductal dilatation. Pancreas: Negative. Spleen: Negative. Adrenals/Urinary Tract: Adrenal glands are unremarkable. There may be punctate stones in the upper pole right kidney. Mild right hydronephrosis secondary to a 5 mm stone in the distal right ureter (3/72). Left ureter is decompressed. Bladder is low in volume. Stomach/Bowel: Stomach, small bowel and colon are unremarkable. Appendix is not readily visualized. Vascular/Lymphatic: Atherosclerotic calcification of the aorta. No pathologically enlarged lymph nodes. Reproductive: Uterus is visualized.  No adnexal mass. Other: No free fluid.  Mesenteries and peritoneum are unremarkable. Musculoskeletal: Degenerative changes  and scoliosis in the spine. No worrisome lytic or sclerotic lesions. IMPRESSION: 1. Mildly obstructing 5 mm distal right ureteral stone. 2. Probable additional punctate stones in the right kidney. 3.  Aortic atherosclerosis (ICD10-I70.0). Electronically Signed   By: Leanna Battles M.D.   On: 07/06/2021 14:28    Procedures Procedures   Medications Ordered in ED Medications  naproxen (NAPROSYN) tablet 375 mg (has no administration in time range)    ED Course  I have reviewed the triage vital signs and the nursing notes.  Pertinent labs & imaging results that were available during my care of the patient were reviewed by me and considered in my medical decision making (see chart for details).    MDM Rules/Calculators/A&P                          55 year old female presents the emergency department concern for right flank pain and urinary frequency/dysuria.  Vitals are normal and stable on arrival, right side of the abdomen is slightly tender to palpation.  Blood work shows normal kidney function, no leukocytosis, urinalysis is leukoesterase white blood cells and blood.  CAT scan identifies a  mildly obstructing 5 mm stone in the right distal ureter.  Patient is well-appearing, eating and drinking without difficulty, tolerated oral medicine.  Consulted on-call urologist Dr. Arita Miss who agrees with continuing antibiotics and sending her home with medication to pass the kidney stone with strict return to ED precautions.  Patient will call the urology office tomorrow to get outpatient follow-up.  Patient will be discharged and treated as an outpatient.  Discharge plan and strict return to ED precautions discussed, patient verbalizes understanding and agreement.  Final Clinical Impression(s) / ED Diagnoses Final diagnoses:  None    Rx / DC Orders ED Discharge Orders     None        Rozelle Logan, DO 07/06/21 1530

## 2021-07-06 NOTE — ED Provider Notes (Signed)
Emergency Medicine Provider Triage Evaluation Note  Ranelle Auker , a 55 y.o. female  was evaluated in triage.  Pt complains of dysuria and suprapubic abdominal pain. Recently diagnosed with UTI.  Was taking macrobid, but was then switched to levaquin.  Reports worsening pain with chills.  Denies nausea or vomiting.  Review of Systems  Positive: Dysuria, abdominal pain Negative: vomiting  Physical Exam  There were no vitals taken for this visit. Gen:   Awake, no distress   Resp:  Normal effort  MSK:   Moves extremities without difficulty  Other:    Medical Decision Making  Medically screening exam initiated at 6:19 AM.  Appropriate orders placed.  Jaylee Freeze was informed that the remainder of the evaluation will be completed by another provider, this initial triage assessment does not replace that evaluation, and the importance of remaining in the ED until their evaluation is complete.     Roxy Horseman, PA-C 07/06/21 [id number]    Marily Memos, MD 07/06/21 [phone]

## 2021-07-06 NOTE — Discharge Instructions (Addendum)
You have been seen and discharged from the emergency department.  You were found to have a 5 mm kidney stone on the right.  Take antibiotics, pain medicine, Flomax and nausea medicine as directed.  In regards to the pain medicine, do not mix this medication with alcohol or other sedating medications. Do not drive or do heavy physical activity and to know how this medication affects you.  It may cause drowsiness.  Follow-up with urology at the provided contact information for reevaluation and further care. Take home medications as prescribed. If you have any worsening symptoms, severe right-sided flank pain, high fevers or further concerns for your health please return to an emergency department for further evaluation.

## 2021-07-06 NOTE — ED Notes (Signed)
MD at bedside hold naprosyn order she is placing a stronger medication

## 2021-07-07 DIAGNOSIS — N23 Unspecified renal colic: Secondary | ICD-10-CM | POA: Diagnosis not present

## 2021-07-07 DIAGNOSIS — N201 Calculus of ureter: Secondary | ICD-10-CM | POA: Diagnosis not present

## 2021-07-07 DIAGNOSIS — N132 Hydronephrosis with renal and ureteral calculous obstruction: Secondary | ICD-10-CM | POA: Diagnosis not present

## 2021-07-07 LAB — URINE CULTURE: Culture: NO GROWTH

## 2021-07-14 DIAGNOSIS — N201 Calculus of ureter: Secondary | ICD-10-CM | POA: Diagnosis not present

## 2021-07-16 ENCOUNTER — Other Ambulatory Visit: Payer: Self-pay | Admitting: Urology

## 2021-07-16 DIAGNOSIS — N201 Calculus of ureter: Secondary | ICD-10-CM

## 2021-07-17 ENCOUNTER — Encounter (HOSPITAL_BASED_OUTPATIENT_CLINIC_OR_DEPARTMENT_OTHER): Payer: Self-pay | Admitting: Urology

## 2021-07-20 ENCOUNTER — Encounter (HOSPITAL_BASED_OUTPATIENT_CLINIC_OR_DEPARTMENT_OTHER): Payer: Self-pay | Admitting: Urology

## 2021-07-20 ENCOUNTER — Encounter (HOSPITAL_BASED_OUTPATIENT_CLINIC_OR_DEPARTMENT_OTHER)
Admission: RE | Disposition: A | Discharge status: Home / Self Care | Payer: Self-pay | Source: Home / Self Care | Attending: Urology

## 2021-07-20 ENCOUNTER — Ambulatory Visit (HOSPITAL_BASED_OUTPATIENT_CLINIC_OR_DEPARTMENT_OTHER)
Admission: RE | Admit: 2021-07-20 | Discharge: 2021-07-20 | Disposition: A | Discharge status: Home / Self Care | Payer: BC Managed Care – PPO | Attending: Urology | Admitting: Urology

## 2021-07-20 ENCOUNTER — Ambulatory Visit (HOSPITAL_COMMUNITY): Payer: BC Managed Care – PPO

## 2021-07-20 DIAGNOSIS — Z885 Allergy status to narcotic agent status: Secondary | ICD-10-CM | POA: Insufficient documentation

## 2021-07-20 DIAGNOSIS — M419 Scoliosis, unspecified: Secondary | ICD-10-CM | POA: Diagnosis not present

## 2021-07-20 DIAGNOSIS — R7303 Prediabetes: Secondary | ICD-10-CM | POA: Diagnosis not present

## 2021-07-20 DIAGNOSIS — N201 Calculus of ureter: Secondary | ICD-10-CM | POA: Insufficient documentation

## 2021-07-20 DIAGNOSIS — Z880 Allergy status to penicillin: Secondary | ICD-10-CM | POA: Insufficient documentation

## 2021-07-20 DIAGNOSIS — F17200 Nicotine dependence, unspecified, uncomplicated: Secondary | ICD-10-CM | POA: Insufficient documentation

## 2021-07-20 DIAGNOSIS — Z87442 Personal history of urinary calculi: Secondary | ICD-10-CM | POA: Diagnosis not present

## 2021-07-20 HISTORY — DX: Prediabetes: R73.03

## 2021-07-20 HISTORY — DX: Anxiety disorder, unspecified: F41.9

## 2021-07-20 HISTORY — PX: EXTRACORPOREAL SHOCK WAVE LITHOTRIPSY: SHX1557

## 2021-07-20 HISTORY — DX: Hypothyroidism, unspecified: E03.9

## 2021-07-20 HISTORY — DX: Scoliosis, unspecified: M41.9

## 2021-07-20 SURGERY — LITHOTRIPSY, ESWL
Anesthesia: LOCAL | Laterality: Right

## 2021-07-20 MED ORDER — CIPROFLOXACIN HCL 500 MG PO TABS
500.0000 mg | ORAL_TABLET | ORAL | Status: AC
  Administered 2021-07-20: 500 mg via ORAL

## 2021-07-20 MED ORDER — DIPHENHYDRAMINE HCL 25 MG PO CAPS
ORAL_CAPSULE | ORAL | Status: AC
  Filled 2021-07-20: qty 1

## 2021-07-20 MED ORDER — SODIUM CHLORIDE 0.9 % IV SOLN: INTRAVENOUS | Status: DC

## 2021-07-20 MED ORDER — SENNOSIDES-DOCUSATE SODIUM 8.6-50 MG PO TABS: 1.0000 | ORAL_TABLET | Freq: Two times a day (BID) | ORAL | 0 refills | Status: DC

## 2021-07-20 MED ORDER — CIPROFLOXACIN HCL 500 MG PO TABS
ORAL_TABLET | ORAL | Status: AC
  Filled 2021-07-20: qty 1

## 2021-07-20 MED ORDER — DIAZEPAM 5 MG PO TABS
ORAL_TABLET | ORAL | Status: AC
  Filled 2021-07-20: qty 2

## 2021-07-20 MED ORDER — OXYCODONE-ACETAMINOPHEN 5-325 MG PO TABS: 1.0000 | ORAL_TABLET | Freq: Four times a day (QID) | ORAL | 0 refills | Status: DC | PRN

## 2021-07-20 MED ORDER — DIAZEPAM 5 MG PO TABS
10.0000 mg | ORAL_TABLET | ORAL | Status: AC
  Administered 2021-07-20: 10 mg via ORAL

## 2021-07-20 MED ORDER — DIPHENHYDRAMINE HCL 25 MG PO CAPS: 25.0000 mg | ORAL_CAPSULE | ORAL | Status: DC

## 2021-07-20 NOTE — Brief Op Note (Signed)
07/20/2021  11:20 AM  PATIENT:  Brenda Johnston  55 y.o. female  PRE-OPERATIVE DIAGNOSIS:  RIGHT DISTAL URETRAL STONE  POST-OPERATIVE DIAGNOSIS:  * No post-op diagnosis entered *  PROCEDURE:  Procedure(s): EXTRACORPOREAL SHOCK WAVE LITHOTRIPSY (ESWL) (Right)  SURGEON:  Surgeon(s) and Role:    * Alexis Frock, MD - Primary  PHYSICIAN ASSISTANT:   ASSISTANTS: none   ANESTHESIA:   MAC  EBL:  miimal   BLOOD ADMINISTERED:none  DRAINS: none   LOCAL MEDICATIONS USED:  NONE  SPECIMEN:  No Specimen  DISPOSITION OF SPECIMEN:  N/A  COUNTS:  YES  TOURNIQUET:  * No tourniquets in log *  DICTATION: .Note written in paper chart  PLAN OF CARE: Discharge to home after PACU  PATIENT DISPOSITION:  Short Stay   Delay start of Pharmacological VTE agent (>24hrs) due to surgical blood loss or risk of bleeding: not applicable

## 2021-07-20 NOTE — H&P (Signed)
Brenda Johnston is an 55 y.o. female.    Chief Complaint: RIGHT Distal Ureteral Stone  HPI:   1 - RIGHT Distal Ureteral Stone - 50mm fusiorm Rt distal stone by CT and KUB 06/2021 on eval flank pain. Stone at level of mid femoral head and no interval passage with medical therapy x few weeks.  Today "Brenda Johnston" is seen to proceed with RIGHT shockwave lithotripsy. NO interval fevers. Most recent UCX negative.   Past Medical History:  Diagnosis Date   Anxiety    Hypothyroidism    Pre-diabetes    Scoliosis     History reviewed. No pertinent surgical history.  Family History  Problem Relation Age of Onset   Cancer Mother    Stroke Father    Diabetes Sister    Hypertension Brother    Social History:  reports that she has been smoking. She has never used smokeless tobacco. She reports that she does not drink alcohol and does not use drugs.  Allergies:  Allergies  Allergen Reactions   Codeine Nausea Only   Penicillins Nausea Only    No medications prior to admission.    No results found for this or any previous visit (from the past 48 hour(s)). No results found.  Review of Systems  Constitutional:  Negative for chills and fatigue.  Genitourinary:  Positive for urgency.  All other systems reviewed and are negative.  There were no vitals taken for this visit. Physical Exam Vitals reviewed.  HENT:     Head: Normocephalic.     Nose: Nose normal.     Mouth/Throat:     Mouth: Mucous membranes are moist.  Eyes:     Pupils: Pupils are equal, round, and reactive to light.  Cardiovascular:     Rate and Rhythm: Normal rate.  Pulmonary:     Effort: Pulmonary effort is normal.  Abdominal:     General: Abdomen is flat.  Genitourinary:    Comments: Mild Rt CVAT at present Musculoskeletal:        General: Normal range of motion.     Cervical back: Normal range of motion.  Skin:    General: Skin is warm.  Neurological:     General: No focal deficit present.     Mental Status: She  is alert.  Psychiatric:        Mood and Affect: Mood normal.     Assessment/Plan  1 - RIGHT Distal Ureteral Stone - proceed as planned with RIGHT shockwave lithotripsy for distal stone. Risks, benefits, alternatives, expected peri-treatment course discussed prevoiusly and reiterated today.   Sebastian Ache, MD 07/20/2021, 7:21 AM

## 2021-07-20 NOTE — Discharge Instructions (Signed)

## 2021-07-21 ENCOUNTER — Encounter (HOSPITAL_BASED_OUTPATIENT_CLINIC_OR_DEPARTMENT_OTHER): Payer: Self-pay | Admitting: Urology

## 2021-09-07 ENCOUNTER — Emergency Department (HOSPITAL_BASED_OUTPATIENT_CLINIC_OR_DEPARTMENT_OTHER): Payer: BC Managed Care – PPO

## 2021-09-07 ENCOUNTER — Encounter (HOSPITAL_BASED_OUTPATIENT_CLINIC_OR_DEPARTMENT_OTHER): Payer: Self-pay

## 2021-09-07 ENCOUNTER — Emergency Department (HOSPITAL_BASED_OUTPATIENT_CLINIC_OR_DEPARTMENT_OTHER): Payer: BC Managed Care – PPO | Admitting: Radiology

## 2021-09-07 ENCOUNTER — Emergency Department (HOSPITAL_BASED_OUTPATIENT_CLINIC_OR_DEPARTMENT_OTHER)
Admission: EM | Admit: 2021-09-07 | Discharge: 2021-09-07 | Disposition: A | Discharge status: Home / Self Care | Payer: BC Managed Care – PPO | Attending: Emergency Medicine | Admitting: Emergency Medicine

## 2021-09-07 ENCOUNTER — Other Ambulatory Visit: Payer: Self-pay

## 2021-09-07 DIAGNOSIS — M25562 Pain in left knee: Secondary | ICD-10-CM | POA: Insufficient documentation

## 2021-09-07 DIAGNOSIS — S92511A Displaced fracture of proximal phalanx of right lesser toe(s), initial encounter for closed fracture: Secondary | ICD-10-CM | POA: Insufficient documentation

## 2021-09-07 DIAGNOSIS — F17210 Nicotine dependence, cigarettes, uncomplicated: Secondary | ICD-10-CM | POA: Insufficient documentation

## 2021-09-07 DIAGNOSIS — T1490XA Injury, unspecified, initial encounter: Secondary | ICD-10-CM

## 2021-09-07 DIAGNOSIS — Z79899 Other long term (current) drug therapy: Secondary | ICD-10-CM | POA: Diagnosis not present

## 2021-09-07 DIAGNOSIS — S3991XA Unspecified injury of abdomen, initial encounter: Secondary | ICD-10-CM | POA: Diagnosis not present

## 2021-09-07 DIAGNOSIS — Y92410 Unspecified street and highway as the place of occurrence of the external cause: Secondary | ICD-10-CM | POA: Diagnosis not present

## 2021-09-07 DIAGNOSIS — S99921A Unspecified injury of right foot, initial encounter: Secondary | ICD-10-CM | POA: Diagnosis not present

## 2021-09-07 DIAGNOSIS — S92421A Displaced fracture of distal phalanx of right great toe, initial encounter for closed fracture: Secondary | ICD-10-CM | POA: Diagnosis not present

## 2021-09-07 DIAGNOSIS — J441 Chronic obstructive pulmonary disease with (acute) exacerbation: Secondary | ICD-10-CM | POA: Diagnosis not present

## 2021-09-07 DIAGNOSIS — S92411A Displaced fracture of proximal phalanx of right great toe, initial encounter for closed fracture: Secondary | ICD-10-CM | POA: Diagnosis not present

## 2021-09-07 DIAGNOSIS — M79673 Pain in unspecified foot: Secondary | ICD-10-CM | POA: Diagnosis not present

## 2021-09-07 DIAGNOSIS — R0789 Other chest pain: Secondary | ICD-10-CM | POA: Diagnosis not present

## 2021-09-07 DIAGNOSIS — R1011 Right upper quadrant pain: Secondary | ICD-10-CM | POA: Diagnosis not present

## 2021-09-07 DIAGNOSIS — M25571 Pain in right ankle and joints of right foot: Secondary | ICD-10-CM | POA: Diagnosis not present

## 2021-09-07 DIAGNOSIS — S50312A Abrasion of left elbow, initial encounter: Secondary | ICD-10-CM | POA: Insufficient documentation

## 2021-09-07 DIAGNOSIS — D72829 Elevated white blood cell count, unspecified: Secondary | ICD-10-CM | POA: Diagnosis not present

## 2021-09-07 DIAGNOSIS — Z7951 Long term (current) use of inhaled steroids: Secondary | ICD-10-CM | POA: Insufficient documentation

## 2021-09-07 DIAGNOSIS — R0902 Hypoxemia: Secondary | ICD-10-CM | POA: Diagnosis not present

## 2021-09-07 DIAGNOSIS — E039 Hypothyroidism, unspecified: Secondary | ICD-10-CM | POA: Insufficient documentation

## 2021-09-07 DIAGNOSIS — S92514A Nondisplaced fracture of proximal phalanx of right lesser toe(s), initial encounter for closed fracture: Secondary | ICD-10-CM | POA: Diagnosis not present

## 2021-09-07 DIAGNOSIS — Z23 Encounter for immunization: Secondary | ICD-10-CM | POA: Diagnosis not present

## 2021-09-07 DIAGNOSIS — S92919A Unspecified fracture of unspecified toe(s), initial encounter for closed fracture: Secondary | ICD-10-CM

## 2021-09-07 DIAGNOSIS — M7989 Other specified soft tissue disorders: Secondary | ICD-10-CM | POA: Diagnosis not present

## 2021-09-07 DIAGNOSIS — S299XXA Unspecified injury of thorax, initial encounter: Secondary | ICD-10-CM | POA: Diagnosis not present

## 2021-09-07 LAB — CBC WITH DIFFERENTIAL/PLATELET
Abs Immature Granulocytes: 0.04 10*3/uL (ref 0.00–0.07)
Basophils Absolute: 0.1 10*3/uL (ref 0.0–0.1)
Basophils Relative: 1 %
Eosinophils Absolute: 0 10*3/uL (ref 0.0–0.5)
Eosinophils Relative: 0 %
HCT: 41.5 % (ref 36.0–46.0)
Hemoglobin: 14.1 g/dL (ref 12.0–15.0)
Immature Granulocytes: 0 %
Lymphocytes Relative: 17 %
Lymphs Abs: 1.9 10*3/uL (ref 0.7–4.0)
MCH: 31.8 pg (ref 26.0–34.0)
MCHC: 34 g/dL (ref 30.0–36.0)
MCV: 93.7 fL (ref 80.0–100.0)
Monocytes Absolute: 0.5 10*3/uL (ref 0.1–1.0)
Monocytes Relative: 4 %
Neutro Abs: 8.7 10*3/uL — ABNORMAL HIGH (ref 1.7–7.7)
Neutrophils Relative %: 78 %
Platelets: 195 10*3/uL (ref 150–400)
RBC: 4.43 MIL/uL (ref 3.87–5.11)
RDW: 12.4 % (ref 11.5–15.5)
WBC: 11.2 10*3/uL — ABNORMAL HIGH (ref 4.0–10.5)
nRBC: 0 % (ref 0.0–0.2)

## 2021-09-07 LAB — BASIC METABOLIC PANEL
Anion gap: 10 (ref 5–15)
BUN: 14 mg/dL (ref 6–20)
CO2: 24 mmol/L (ref 22–32)
Calcium: 9.3 mg/dL (ref 8.9–10.3)
Chloride: 106 mmol/L (ref 98–111)
Creatinine, Ser: 0.7 mg/dL (ref 0.44–1.00)
GFR, Estimated: >60 mL/min (ref >60)
Glucose, Bld: 119 mg/dL — ABNORMAL HIGH (ref 70–99)
Potassium: 3.5 mmol/L (ref 3.5–5.1)
Sodium: 140 mmol/L (ref 135–145)

## 2021-09-07 MED ORDER — SODIUM CHLORIDE 0.9 % IV BOLUS
1000.0000 mL | Freq: Once | INTRAVENOUS | Status: AC
  Administered 2021-09-07: 1000 mL via INTRAVENOUS

## 2021-09-07 MED ORDER — MORPHINE SULFATE (PF) 4 MG/ML IV SOLN
4.0000 mg | Freq: Once | INTRAVENOUS | Status: AC
  Administered 2021-09-07: 4 mg via INTRAVENOUS
  Filled 2021-09-07: qty 1

## 2021-09-07 MED ORDER — HYDROCODONE-ACETAMINOPHEN 5-325 MG PO TABS: 1.0000 | ORAL_TABLET | Freq: Four times a day (QID) | ORAL | 0 refills | Status: DC | PRN

## 2021-09-07 MED ORDER — TETANUS-DIPHTH-ACELL PERTUSSIS 5-2.5-18.5 LF-MCG/0.5 IM SUSY
0.5000 mL | PREFILLED_SYRINGE | Freq: Once | INTRAMUSCULAR | Status: AC
  Administered 2021-09-07: 0.5 mL via INTRAMUSCULAR
  Filled 2021-09-07: qty 0.5

## 2021-09-07 MED ORDER — HYDROCODONE-ACETAMINOPHEN 5-325 MG PO TABS
1.0000 | ORAL_TABLET | Freq: Once | ORAL | Status: AC
  Administered 2021-09-07: 1 via ORAL
  Filled 2021-09-07: qty 1

## 2021-09-07 MED ORDER — ONDANSETRON HCL 4 MG/2ML IJ SOLN
4.0000 mg | Freq: Once | INTRAMUSCULAR | Status: AC
  Administered 2021-09-07: 4 mg via INTRAVENOUS
  Filled 2021-09-07: qty 2

## 2021-09-07 MED ORDER — IOHEXOL 350 MG/ML SOLN
80.0000 mL | Freq: Once | INTRAVENOUS | Status: AC | PRN
  Administered 2021-09-07: 80 mL via INTRAVENOUS

## 2021-09-07 MED ORDER — FENTANYL CITRATE PF 50 MCG/ML IJ SOSY
50.0000 ug | PREFILLED_SYRINGE | Freq: Once | INTRAMUSCULAR | Status: AC
  Administered 2021-09-07: 50 ug via INTRAVENOUS
  Filled 2021-09-07: qty 1
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
## 2021-09-07 NOTE — Discharge Instructions (Addendum)
Use the incentive spirometer 10 times for every hour while you are awake to help encourage you to fill your lungs fully and prevent pneumonia.  Prescription given for Vicodin. Take medication as directed and do not operate machinery, drive a car, or work while taking this medication as it can make you drowsy.   Keep your foot elevated did not bear weight on your foot until he can follow-up with the orthopedic doctor.  You will need to call the orthopedic doctor to schedule an appointment for follow-up.  Please follow up with your primary care provider within 5-7 days for re-evaluation of your symptoms. If you do not have a primary care provider, information for a healthcare clinic has been provided for you to make arrangements for follow up care. Please return to the emergency department for any new or worsening symptoms.
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
## 2021-09-14 ENCOUNTER — Other Ambulatory Visit: Payer: Self-pay

## 2021-09-14 ENCOUNTER — Encounter (HOSPITAL_BASED_OUTPATIENT_CLINIC_OR_DEPARTMENT_OTHER): Payer: Self-pay | Admitting: Emergency Medicine

## 2021-09-14 ENCOUNTER — Emergency Department (HOSPITAL_BASED_OUTPATIENT_CLINIC_OR_DEPARTMENT_OTHER): Payer: BC Managed Care – PPO | Admitting: Radiology

## 2021-09-14 ENCOUNTER — Emergency Department (HOSPITAL_BASED_OUTPATIENT_CLINIC_OR_DEPARTMENT_OTHER)
Admission: EM | Admit: 2021-09-14 | Discharge: 2021-09-14 | Disposition: A | Discharge status: Home / Self Care | Payer: BC Managed Care – PPO | Attending: Emergency Medicine | Admitting: Emergency Medicine

## 2021-09-14 DIAGNOSIS — R0602 Shortness of breath: Secondary | ICD-10-CM | POA: Insufficient documentation

## 2021-09-14 DIAGNOSIS — Z79899 Other long term (current) drug therapy: Secondary | ICD-10-CM | POA: Insufficient documentation

## 2021-09-14 DIAGNOSIS — Y92410 Unspecified street and highway as the place of occurrence of the external cause: Secondary | ICD-10-CM | POA: Insufficient documentation

## 2021-09-14 DIAGNOSIS — J441 Chronic obstructive pulmonary disease with (acute) exacerbation: Secondary | ICD-10-CM | POA: Insufficient documentation

## 2021-09-14 DIAGNOSIS — R0789 Other chest pain: Secondary | ICD-10-CM | POA: Insufficient documentation

## 2021-09-14 DIAGNOSIS — R079 Chest pain, unspecified: Secondary | ICD-10-CM | POA: Diagnosis not present

## 2021-09-14 DIAGNOSIS — S92911A Unspecified fracture of right toe(s), initial encounter for closed fracture: Secondary | ICD-10-CM | POA: Diagnosis not present

## 2021-09-14 DIAGNOSIS — F17210 Nicotine dependence, cigarettes, uncomplicated: Secondary | ICD-10-CM | POA: Insufficient documentation

## 2021-09-14 DIAGNOSIS — E039 Hypothyroidism, unspecified: Secondary | ICD-10-CM | POA: Insufficient documentation

## 2021-09-14 DIAGNOSIS — S92413A Displaced fracture of proximal phalanx of unspecified great toe, initial encounter for closed fracture: Secondary | ICD-10-CM | POA: Insufficient documentation

## 2021-09-14 MED ORDER — MELOXICAM 7.5 MG PO TABS: 7.5000 mg | ORAL_TABLET | Freq: Every day | ORAL | 0 refills | Status: DC

## 2021-09-14 MED ORDER — HYDROCODONE-ACETAMINOPHEN 5-325 MG PO TABS
1.0000 | ORAL_TABLET | Freq: Once | ORAL | Status: AC
Start: 2021-09-14 — End: 2021-09-14
  Administered 2021-09-14: 1 via ORAL
  Filled 2021-09-14: qty 1

## 2021-09-14 MED ORDER — METHOCARBAMOL 500 MG PO TABS: 500.0000 mg | ORAL_TABLET | Freq: Two times a day (BID) | ORAL | 0 refills | Status: DC

## 2021-09-14 MED ORDER — DICLOFENAC SODIUM 1 % EX GEL: 2.0000 g | Freq: Four times a day (QID) | CUTANEOUS | 0 refills | Status: DC
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
## 2021-09-14 NOTE — ED Notes (Signed)
Pt discharged home after verbalizing understanding of discharge instructions; nad noted. 

## 2021-09-14 NOTE — ED Notes (Signed)
ED Provider at bedside. 

## 2021-09-14 NOTE — Discharge Instructions (Addendum)
Your chest x-ray showed no evidence of rib fracture. I think your pain is likely from a muscle strain or inflammation of the cartilage of your ribs. This is best treated by bringing down the inflammation and relaxing the muscles.   I am prescribing you meloxicam, which is an anti-inflammatory as well as voltaren gel, which is a topical gel for inflammation. I am also prescribing you robaxin, which is a muscle relaxer. Do not drink alcohol while taking this medication and refrain from operating machinery until you know how it affects you.

## 2021-09-14 NOTE — ED Notes (Signed)
Patient transported to X-ray 
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
## 2021-09-21 ENCOUNTER — Ambulatory Visit: Payer: BC Managed Care – PPO | Admitting: Registered Nurse

## 2021-09-21 ENCOUNTER — Encounter: Payer: Self-pay | Admitting: Registered Nurse

## 2021-09-21 ENCOUNTER — Other Ambulatory Visit: Payer: Self-pay

## 2021-09-21 DIAGNOSIS — E039 Hypothyroidism, unspecified: Secondary | ICD-10-CM

## 2021-09-21 DIAGNOSIS — Z130 Encounter for screening for diseases of the blood and blood-forming organs and certain disorders involving the immune mechanism: Secondary | ICD-10-CM | POA: Diagnosis not present

## 2021-09-21 DIAGNOSIS — K219 Gastro-esophageal reflux disease without esophagitis: Secondary | ICD-10-CM | POA: Diagnosis not present

## 2021-09-21 DIAGNOSIS — Z1329 Encounter for screening for other suspected endocrine disorder: Secondary | ICD-10-CM

## 2021-09-21 DIAGNOSIS — Z13220 Encounter for screening for lipoid disorders: Secondary | ICD-10-CM

## 2021-09-21 DIAGNOSIS — F410 Panic disorder [episodic paroxysmal anxiety] without agoraphobia: Secondary | ICD-10-CM

## 2021-09-21 DIAGNOSIS — Z13228 Encounter for screening for other metabolic disorders: Secondary | ICD-10-CM

## 2021-09-21 DIAGNOSIS — F411 Generalized anxiety disorder: Secondary | ICD-10-CM

## 2021-09-21 LAB — CBC WITH DIFFERENTIAL/PLATELET
Basophils Absolute: 0.1 10*3/uL (ref 0.0–0.1)
Basophils Relative: 0.8 % (ref 0.0–3.0)
Eosinophils Absolute: 0 10*3/uL (ref 0.0–0.7)
Eosinophils Relative: 0.2 % (ref 0.0–5.0)
HCT: 44.4 % (ref 36.0–46.0)
Hemoglobin: 15.1 g/dL — ABNORMAL HIGH (ref 12.0–15.0)
Lymphocytes Relative: 18.3 % (ref 12.0–46.0)
Lymphs Abs: 1.2 10*3/uL (ref 0.7–4.0)
MCHC: 34.1 g/dL (ref 30.0–36.0)
MCV: 93 fl (ref 78.0–100.0)
Monocytes Absolute: 0.1 10*3/uL (ref 0.1–1.0)
Monocytes Relative: 1.9 % — ABNORMAL LOW (ref 3.0–12.0)
Neutro Abs: 5.2 10*3/uL (ref 1.4–7.7)
Neutrophils Relative %: 78.8 % — ABNORMAL HIGH (ref 43.0–77.0)
Platelets: 247 10*3/uL (ref 150.0–400.0)
RBC: 4.77 Mil/uL (ref 3.87–5.11)
RDW: 12.9 % (ref 11.5–15.5)
WBC: 6.6 10*3/uL (ref 4.0–10.5)

## 2021-09-21 LAB — COMPREHENSIVE METABOLIC PANEL
ALT: 10 U/L (ref 0–35)
AST: 9 U/L (ref 0–37)
Albumin: 4.4 g/dL (ref 3.5–5.2)
Alkaline Phosphatase: 92 U/L (ref 39–117)
BUN: 14 mg/dL (ref 6–23)
CO2: 23 mEq/L (ref 19–32)
Calcium: 9.7 mg/dL (ref 8.4–10.5)
Chloride: 101 mEq/L (ref 96–112)
Creatinine, Ser: 0.75 mg/dL (ref 0.40–1.20)
GFR: 89.9 mL/min (ref >60.00)
Glucose, Bld: 124 mg/dL — ABNORMAL HIGH (ref 70–99)
Potassium: 4.3 mEq/L (ref 3.5–5.1)
Sodium: 136 mEq/L (ref 135–145)
Total Bilirubin: 0.6 mg/dL (ref 0.2–1.2)
Total Protein: 6.9 g/dL (ref 6.0–8.3)

## 2021-09-21 LAB — HEMOGLOBIN A1C: Hgb A1c MFr Bld: 5.7 % (ref 4.6–6.5)

## 2021-09-21 LAB — LIPID PANEL
Cholesterol: 212 mg/dL — ABNORMAL HIGH (ref 0–200)
HDL: 51.5 mg/dL (ref >39.00)
LDL Cholesterol: 147 mg/dL — ABNORMAL HIGH (ref 0–99)
NonHDL: 160.05
Total CHOL/HDL Ratio: 4
Triglycerides: 67 mg/dL (ref 0.0–149.0)
VLDL: 13.4 mg/dL (ref 0.0–40.0)

## 2021-09-21 LAB — TSH: TSH: 3.57 u[IU]/mL (ref 0.35–5.50)

## 2021-09-21 MED ORDER — ALPRAZOLAM 0.25 MG PO TBDP: 0.2500 mg | ORAL_TABLET | Freq: Three times a day (TID) | ORAL | 1 refills | Status: DC | PRN

## 2021-09-21 MED ORDER — DICLOFENAC SODIUM 1 % EX GEL: 2.0000 g | Freq: Four times a day (QID) | CUTANEOUS | 0 refills | Status: DC

## 2021-09-21 MED ORDER — PREDNISONE 10 MG (21) PO TBPK: ORAL_TABLET | ORAL | 0 refills | Status: DC

## 2021-09-21 MED ORDER — OMEPRAZOLE 20 MG PO CPDR: 20.0000 mg | DELAYED_RELEASE_CAPSULE | Freq: Every day | ORAL | 3 refills | Status: DC

## 2021-09-21 MED ORDER — BUSPIRONE HCL 7.5 MG PO TABS: 7.5000 mg | ORAL_TABLET | Freq: Three times a day (TID) | ORAL | 3 refills | Status: DC

## 2021-09-21 MED ORDER — TRAMADOL HCL 50 MG PO TABS: 50.0000 mg | ORAL_TABLET | Freq: Three times a day (TID) | ORAL | 0 refills | Status: AC | PRN

## 2021-09-21 MED ORDER — MELOXICAM 7.5 MG PO TABS: 7.5000 mg | ORAL_TABLET | Freq: Every day | ORAL | 0 refills | Status: DC

## 2021-09-21 MED ORDER — CYCLOBENZAPRINE HCL 10 MG PO TABS
10.0000 mg | ORAL_TABLET | Freq: Three times a day (TID) | ORAL | 0 refills | Status: DC | PRN
Start: 2021-09-21 — End: 2021-10-13

## 2021-09-21 MED ORDER — LEVOTHYROXINE SODIUM 112 MCG PO TABS: 112.0000 ug | ORAL_TABLET | Freq: Every day | ORAL | 1 refills | Status: DC

## 2021-09-21 NOTE — Addendum Note (Signed)
Addended by: Janeece Agee on: 09/21/2021 02:07 PM   Modules accepted: Orders
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
## 2021-09-28 ENCOUNTER — Telehealth: Payer: Self-pay

## 2021-09-28 DIAGNOSIS — S92911A Unspecified fracture of right toe(s), initial encounter for closed fracture: Secondary | ICD-10-CM | POA: Diagnosis not present

## 2021-09-28 NOTE — Telephone Encounter (Signed)
Please Advise

## 2021-09-28 NOTE — Telephone Encounter (Signed)
Pt needs refill on alprazolam 0.25 mg PLEASE NOTE INSURANCE WILL NO LONGER COVER DISSOLVABLE TABLET but will over regular tablet and pt needs RX sent to [id number] [place], [place] - [address] AT   Pt call back [phone]

## 2021-09-29 ENCOUNTER — Other Ambulatory Visit: Payer: Self-pay | Admitting: Registered Nurse

## 2021-09-29 DIAGNOSIS — F411 Generalized anxiety disorder: Secondary | ICD-10-CM

## 2021-09-29 DIAGNOSIS — F410 Panic disorder [episodic paroxysmal anxiety] without agoraphobia: Secondary | ICD-10-CM

## 2021-09-29 MED ORDER — ALPRAZOLAM 0.25 MG PO TABS: 0.2500 mg | ORAL_TABLET | Freq: Two times a day (BID) | ORAL | 0 refills | Status: AC | PRN

## 2021-09-30 NOTE — Telephone Encounter (Signed)
Sent tabs  Thanks  Rich

## 2021-10-08 ENCOUNTER — Other Ambulatory Visit (HOSPITAL_COMMUNITY): Payer: Self-pay

## 2021-10-08 ENCOUNTER — Other Ambulatory Visit (HOSPITAL_COMMUNITY): Payer: Self-pay | Admitting: Student

## 2021-10-08 DIAGNOSIS — M7989 Other specified soft tissue disorders: Secondary | ICD-10-CM

## 2021-10-08 DIAGNOSIS — M79671 Pain in right foot: Secondary | ICD-10-CM | POA: Diagnosis not present

## 2021-10-08 DIAGNOSIS — M79604 Pain in right leg: Secondary | ICD-10-CM | POA: Diagnosis not present

## 2021-10-08 DIAGNOSIS — M79661 Pain in right lower leg: Secondary | ICD-10-CM | POA: Diagnosis not present

## 2021-10-08 DIAGNOSIS — M79606 Pain in leg, unspecified: Secondary | ICD-10-CM

## 2021-10-09 ENCOUNTER — Telehealth: Payer: Self-pay

## 2021-10-09 ENCOUNTER — Other Ambulatory Visit: Payer: Self-pay

## 2021-10-09 ENCOUNTER — Ambulatory Visit (HOSPITAL_COMMUNITY)
Admission: RE | Admit: 2021-10-09 | Discharge: 2021-10-09 | Disposition: A | Discharge status: Home / Self Care | Payer: BC Managed Care – PPO | Source: Ambulatory Visit | Attending: Registered Nurse | Admitting: Registered Nurse

## 2021-10-09 DIAGNOSIS — M79604 Pain in right leg: Secondary | ICD-10-CM | POA: Diagnosis not present

## 2021-10-09 DIAGNOSIS — M79606 Pain in leg, unspecified: Secondary | ICD-10-CM | POA: Diagnosis not present

## 2021-10-09 DIAGNOSIS — M7989 Other specified soft tissue disorders: Secondary | ICD-10-CM | POA: Diagnosis not present

## 2021-10-09 DIAGNOSIS — I82409 Acute embolism and thrombosis of unspecified deep veins of unspecified lower extremity: Secondary | ICD-10-CM | POA: Insufficient documentation

## 2021-10-09 NOTE — Telephone Encounter (Signed)
FYI patient will be in on 10/15/21
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
## 2021-10-09 NOTE — Progress Notes (Signed)
Lower extremity venous has been completed.   Preliminary results in CV Proc.   Brenda Johnston 10/09/2021 8:18 AM

## 2021-10-12 NOTE — Telephone Encounter (Signed)
Have sent refill  Will see pt on 10/20  Thank you  Rich

## 2021-10-13 ENCOUNTER — Encounter: Payer: Self-pay | Admitting: Registered Nurse

## 2021-10-13 ENCOUNTER — Ambulatory Visit (INDEPENDENT_AMBULATORY_CARE_PROVIDER_SITE_OTHER): Payer: BC Managed Care – PPO | Admitting: Registered Nurse

## 2021-10-13 ENCOUNTER — Other Ambulatory Visit: Payer: Self-pay

## 2021-10-13 VITALS — BP 118/78 | HR 87 | Temp 98.0°F | Resp 18 | Ht 68.0 in | Wt 157.8 lb

## 2021-10-13 DIAGNOSIS — R0789 Other chest pain: Secondary | ICD-10-CM | POA: Diagnosis not present

## 2021-10-13 DIAGNOSIS — I82441 Acute embolism and thrombosis of right tibial vein: Secondary | ICD-10-CM | POA: Diagnosis not present

## 2021-10-13 MED ORDER — TRAMADOL HCL 50 MG PO TABS
50.0000 mg | ORAL_TABLET | Freq: Three times a day (TID) | ORAL | 0 refills | Status: AC | PRN
Start: 2021-10-13 — End: 2021-10-18

## 2021-10-13 MED ORDER — RIVAROXABAN 20 MG PO TABS: 20.0000 mg | ORAL_TABLET | Freq: Every day | ORAL | 0 refills | Status: DC

## 2021-10-13 MED ORDER — CYCLOBENZAPRINE HCL 10 MG PO TABS: 10.0000 mg | ORAL_TABLET | Freq: Three times a day (TID) | ORAL | 0 refills | Status: DC | PRN
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
## 2021-10-13 NOTE — Patient Instructions (Signed)
Ms. Maleya Leever to see you, sorry you're still in pain   Can do tramadol and flexeril for pain  Will pursue CT angiogram to rule out clot or damage to bones  If nothing shows, can consider ortho referral.  Continue xarelto x 3 mo, refill has been sent  Thank you  Luan Pulling

## 2021-10-15 ENCOUNTER — Ambulatory Visit (HOSPITAL_COMMUNITY)
Admission: RE | Admit: 2021-10-15 | Discharge: 2021-10-15 | Disposition: A | Discharge status: Home / Self Care | Payer: BC Managed Care – PPO | Source: Ambulatory Visit | Attending: Registered Nurse | Admitting: Registered Nurse

## 2021-10-15 ENCOUNTER — Ambulatory Visit: Payer: BC Managed Care – PPO | Admitting: Registered Nurse

## 2021-10-15 ENCOUNTER — Other Ambulatory Visit: Payer: Self-pay

## 2021-10-15 DIAGNOSIS — I82441 Acute embolism and thrombosis of right tibial vein: Secondary | ICD-10-CM | POA: Diagnosis not present

## 2021-10-15 DIAGNOSIS — R079 Chest pain, unspecified: Secondary | ICD-10-CM | POA: Diagnosis not present

## 2021-10-15 DIAGNOSIS — R0789 Other chest pain: Secondary | ICD-10-CM | POA: Insufficient documentation

## 2021-10-15 MED ORDER — IOHEXOL 350 MG/ML SOLN
75.0000 mL | Freq: Once | INTRAVENOUS | Status: AC | PRN
  Administered 2021-10-15: 75 mL via INTRAVENOUS

## 2021-10-16 ENCOUNTER — Other Ambulatory Visit: Payer: Self-pay | Admitting: Registered Nurse

## 2021-10-16 DIAGNOSIS — S2220XA Unspecified fracture of sternum, initial encounter for closed fracture: Secondary | ICD-10-CM

## 2021-10-20 ENCOUNTER — Ambulatory Visit (INDEPENDENT_AMBULATORY_CARE_PROVIDER_SITE_OTHER): Payer: BC Managed Care – PPO | Admitting: Orthopaedic Surgery

## 2021-10-20 ENCOUNTER — Other Ambulatory Visit: Payer: Self-pay

## 2021-10-20 DIAGNOSIS — S2222XA Fracture of body of sternum, initial encounter for closed fracture: Secondary | ICD-10-CM

## 2021-10-20 MED ORDER — TRAMADOL HCL 50 MG PO TABS: 50.0000 mg | ORAL_TABLET | Freq: Two times a day (BID) | ORAL | 2 refills | Status: DC | PRN
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
## 2021-10-30 DIAGNOSIS — S92911A Unspecified fracture of right toe(s), initial encounter for closed fracture: Secondary | ICD-10-CM | POA: Diagnosis not present

## 2021-10-30 DIAGNOSIS — M79671 Pain in right foot: Secondary | ICD-10-CM | POA: Diagnosis not present

## 2021-10-30 DIAGNOSIS — S92511D Displaced fracture of proximal phalanx of right lesser toe(s), subsequent encounter for fracture with routine healing: Secondary | ICD-10-CM | POA: Diagnosis not present

## 2021-11-13 ENCOUNTER — Telehealth: Payer: Self-pay | Admitting: Registered Nurse

## 2021-11-13 NOTE — Telephone Encounter (Signed)
..  Caller name:  Brenda Johnston  On DPR? :yes/no: Yes  Call back [phone]  Provider they see:   Kateri Plummer  Reason for call: patient has developed a cough x 1 day - patient had prednisone at home and would like to know if she can take it.  She is also on blood thinners.  Please advise.

## 2021-11-13 NOTE — Telephone Encounter (Signed)
Would recommend visit  Thanks,  Luan Pulling

## 2021-11-13 NOTE — Telephone Encounter (Signed)
Pt asking about taking old prednisone RX pt should be seen for appt for new Rx correct?

## 2021-11-14 DIAGNOSIS — J069 Acute upper respiratory infection, unspecified: Secondary | ICD-10-CM | POA: Diagnosis not present

## 2021-11-14 DIAGNOSIS — R058 Other specified cough: Secondary | ICD-10-CM | POA: Diagnosis not present

## 2021-11-14 DIAGNOSIS — Z8709 Personal history of other diseases of the respiratory system: Secondary | ICD-10-CM | POA: Diagnosis not present

## 2021-11-14 DIAGNOSIS — R0609 Other forms of dyspnea: Secondary | ICD-10-CM | POA: Diagnosis not present

## 2021-11-16 NOTE — Telephone Encounter (Signed)
Pt needs an appt for evaluation.

## 2021-11-18 NOTE — Telephone Encounter (Signed)
I have called and left message for her to call back and make a med check appt.

## 2021-11-20 ENCOUNTER — Other Ambulatory Visit: Payer: Self-pay

## 2021-11-20 ENCOUNTER — Emergency Department (HOSPITAL_BASED_OUTPATIENT_CLINIC_OR_DEPARTMENT_OTHER)
Admission: EM | Admit: 2021-11-20 | Discharge: 2021-11-20 | Disposition: A | Discharge status: Home / Self Care | Payer: BC Managed Care – PPO | Attending: Emergency Medicine | Admitting: Emergency Medicine

## 2021-11-20 ENCOUNTER — Encounter (HOSPITAL_BASED_OUTPATIENT_CLINIC_OR_DEPARTMENT_OTHER): Payer: Self-pay | Admitting: *Deleted

## 2021-11-20 ENCOUNTER — Emergency Department (HOSPITAL_BASED_OUTPATIENT_CLINIC_OR_DEPARTMENT_OTHER): Payer: BC Managed Care – PPO | Admitting: Radiology

## 2021-11-20 DIAGNOSIS — R059 Cough, unspecified: Secondary | ICD-10-CM | POA: Diagnosis not present

## 2021-11-20 DIAGNOSIS — Z20822 Contact with and (suspected) exposure to covid-19: Secondary | ICD-10-CM | POA: Insufficient documentation

## 2021-11-20 DIAGNOSIS — R0789 Other chest pain: Secondary | ICD-10-CM | POA: Diagnosis not present

## 2021-11-20 DIAGNOSIS — Z79899 Other long term (current) drug therapy: Secondary | ICD-10-CM | POA: Insufficient documentation

## 2021-11-20 DIAGNOSIS — J441 Chronic obstructive pulmonary disease with (acute) exacerbation: Secondary | ICD-10-CM

## 2021-11-20 DIAGNOSIS — R0602 Shortness of breath: Secondary | ICD-10-CM | POA: Diagnosis not present

## 2021-11-20 DIAGNOSIS — Z7901 Long term (current) use of anticoagulants: Secondary | ICD-10-CM | POA: Insufficient documentation

## 2021-11-20 DIAGNOSIS — E039 Hypothyroidism, unspecified: Secondary | ICD-10-CM | POA: Diagnosis not present

## 2021-11-20 DIAGNOSIS — F17210 Nicotine dependence, cigarettes, uncomplicated: Secondary | ICD-10-CM | POA: Insufficient documentation

## 2021-11-20 DIAGNOSIS — R531 Weakness: Secondary | ICD-10-CM | POA: Diagnosis not present

## 2021-11-20 LAB — BASIC METABOLIC PANEL
Anion gap: 10 (ref 5–15)
BUN: 18 mg/dL (ref 6–20)
CO2: 25 mmol/L (ref 22–32)
Calcium: 10.1 mg/dL (ref 8.9–10.3)
Chloride: 102 mmol/L (ref 98–111)
Creatinine, Ser: 0.86 mg/dL (ref 0.44–1.00)
GFR, Estimated: >60 mL/min (ref >60)
Glucose, Bld: 107 mg/dL — ABNORMAL HIGH (ref 70–99)
Potassium: 3.6 mmol/L (ref 3.5–5.1)
Sodium: 137 mmol/L (ref 135–145)

## 2021-11-20 LAB — RESP PANEL BY RT-PCR (FLU A&B, COVID) ARPGX2
Influenza A by PCR: NEGATIVE
Influenza B by PCR: NEGATIVE
SARS Coronavirus 2 by RT PCR: NEGATIVE

## 2021-11-20 LAB — CBC
HCT: 48.6 % — ABNORMAL HIGH (ref 36.0–46.0)
Hemoglobin: 16.3 g/dL — ABNORMAL HIGH (ref 12.0–15.0)
MCH: 30.8 pg (ref 26.0–34.0)
MCHC: 33.5 g/dL (ref 30.0–36.0)
MCV: 91.9 fL (ref 80.0–100.0)
Platelets: 150 10*3/uL (ref 150–400)
RBC: 5.29 MIL/uL — ABNORMAL HIGH (ref 3.87–5.11)
RDW: 12.1 % (ref 11.5–15.5)
WBC: 7.3 10*3/uL (ref 4.0–10.5)
nRBC: 0 % (ref 0.0–0.2)

## 2021-11-20 LAB — PREGNANCY, URINE: Preg Test, Ur: NEGATIVE

## 2021-11-20 MED ORDER — ALBUTEROL SULFATE (2.5 MG/3ML) 0.083% IN NEBU
2.5000 mg | INHALATION_SOLUTION | Freq: Once | RESPIRATORY_TRACT | Status: AC
  Administered 2021-11-20: 2.5 mg via RESPIRATORY_TRACT
  Filled 2021-11-20: qty 3

## 2021-11-20 MED ORDER — AEROCHAMBER PLUS FLO-VU MISC: 2 refills | Status: DC

## 2021-11-20 MED ORDER — ALBUTEROL SULFATE HFA 108 (90 BASE) MCG/ACT IN AERS: 2.0000 | INHALATION_SPRAY | RESPIRATORY_TRACT | 0 refills | Status: AC | PRN

## 2021-11-20 MED ORDER — IPRATROPIUM-ALBUTEROL 0.5-2.5 (3) MG/3ML IN SOLN
3.0000 mL | Freq: Once | RESPIRATORY_TRACT | Status: AC
  Administered 2021-11-20: 3 mL via RESPIRATORY_TRACT
  Filled 2021-11-20: qty 3

## 2021-11-20 MED ORDER — PREDNISONE 20 MG PO TABS: ORAL_TABLET | ORAL | 0 refills | Status: DC

## 2021-11-20 MED ORDER — METHYLPREDNISOLONE SODIUM SUCC 125 MG IJ SOLR
125.0000 mg | Freq: Once | INTRAMUSCULAR | Status: AC
  Administered 2021-11-20: 125 mg via INTRAVENOUS
  Filled 2021-11-20: qty 2

## 2021-11-20 MED ORDER — BENZONATATE 100 MG PO CAPS
200.0000 mg | ORAL_CAPSULE | Freq: Three times a day (TID) | ORAL | 0 refills | Status: DC | PRN
Start: 2021-11-20 — End: 2022-09-16

## 2021-11-20 MED ORDER — AZITHROMYCIN 250 MG PO TABS: ORAL_TABLET | ORAL | 0 refills | Status: DC
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
## 2021-11-20 NOTE — Discharge Instructions (Addendum)
1.  Take Zithromax as prescribed.  This is an antibiotic.  Take prednisone as prescribed for the next 5 days.  Use your albuterol inhaler.  You will need 2 puffs every 4 hours.  Use the spacer to help use the medication effectively.  This will be important in decreasing her wheezing and coughing.  You may try Tessalon Perles as prescribed for cough.  You must quit smoking at this time.  Continued irritation of the lungs with tobacco smoke will make it much more difficult to get better and may result in you having to be hospitalized. 2.  Since you have COPD that is difficult to treat at this time, discussed referral to a pulmonologist with your doctor.  You may need additional treatment for COPD since it is persisting and increasing in severity. 3.  Return to the emergency department if you are getting any increasing difficulty with breathing, chest pain, fevers, cough with blood or significant mucus or other concerning symptoms.

## 2021-12-18 ENCOUNTER — Other Ambulatory Visit: Payer: Self-pay | Admitting: Registered Nurse

## 2021-12-21 ENCOUNTER — Other Ambulatory Visit: Payer: Self-pay | Admitting: Registered Nurse

## 2021-12-21 DIAGNOSIS — I82441 Acute embolism and thrombosis of right tibial vein: Secondary | ICD-10-CM

## 2021-12-21 NOTE — Telephone Encounter (Signed)
Patient is requesting a refill of the following medications: Requested Prescriptions   Pending Prescriptions Disp Refills   XARELTO 20 MG TABS tablet [Pharmacy Med Name: XARELTO 20 MG TABLET] 90 tablet 0    Sig: TAKE 1 TABLET BY MOUTH DAILY WITH SUPPER.    Date of patient request: 12/21/2021 Last office visit: 10/13/2021 Date of last refill: 10/13/2021 Last refill amount: 90 tablets Follow up time period per chart: none at the moment

## 2021-12-23 ENCOUNTER — Encounter: Payer: Self-pay | Admitting: Registered Nurse

## 2021-12-23 ENCOUNTER — Telehealth: Payer: Self-pay | Admitting: Registered Nurse

## 2021-12-23 ENCOUNTER — Telehealth: Payer: Self-pay

## 2021-12-23 ENCOUNTER — Telehealth (INDEPENDENT_AMBULATORY_CARE_PROVIDER_SITE_OTHER): Payer: BC Managed Care – PPO | Admitting: Registered Nurse

## 2021-12-23 VITALS — Temp 101.0°F

## 2021-12-23 DIAGNOSIS — J449 Chronic obstructive pulmonary disease, unspecified: Secondary | ICD-10-CM | POA: Diagnosis not present

## 2021-12-23 DIAGNOSIS — U071 COVID-19: Secondary | ICD-10-CM | POA: Diagnosis not present

## 2021-12-23 MED ORDER — DM-GUAIFENESIN ER 30-600 MG PO TB12: 1.0000 | ORAL_TABLET | Freq: Two times a day (BID) | ORAL | 0 refills | Status: DC

## 2021-12-23 MED ORDER — NIRMATRELVIR/RITONAVIR (PAXLOVID)TABLET: 3.0000 | ORAL_TABLET | Freq: Two times a day (BID) | ORAL | 0 refills | Status: AC

## 2021-12-23 MED ORDER — AZELASTINE HCL 0.1 % NA SOLN: 1.0000 | Freq: Two times a day (BID) | NASAL | 12 refills | Status: DC

## 2021-12-23 MED ORDER — FLUTICASONE PROPIONATE HFA 110 MCG/ACT IN AERO: 1.0000 | INHALATION_SPRAY | Freq: Two times a day (BID) | RESPIRATORY_TRACT | 12 refills | Status: DC

## 2021-12-23 NOTE — Patient Instructions (Signed)
° ° ° °  If you have lab work done today you will be contacted with your lab results within the next 2 weeks.  If you have not heard from us then please contact us. The fastest way to get your results is to register for My Chart. ° ° °IF you received an x-ray today, you will receive an invoice from [facility]. Please contact  Radiology at [phone] with questions or concerns regarding your invoice.  ° °IF you received labwork today, you will receive an invoice from [facility]. Please contact [facility] at [phone] with questions or concerns regarding your invoice.  ° °Our billing staff will not be able to assist you with questions regarding bills from these companies. ° °You will be contacted with the lab results as soon as they are available. The fastest way to get your results is to activate your My Chart account. Instructions are located on the last page of this paperwork. If you have not heard from us regarding the results in 2 weeks, please contact this office. °  ° ° ° °

## 2021-12-23 NOTE — Telephone Encounter (Signed)
Caller name:Mabel Meko [place]anger callback [phone]  Encourage patient to contact the pharmacy for refills or they can request refills through [facility]  (Please schedule appointment if patient has not been seen in over a year) ALPRAZolam (XANAX) 0.25 MG tablet  MEDICATION NAME & DOSE:  Notes/Comments from patient:  WHAT PHARMACY WOULD THEY LIKE THIS SENT TO: [id number] - [place], [place] - [address]   Please notify patient: It takes 48-72 hours to process rx refill requests Ask patient to call pharmacy to ensure rx is ready before heading there.   (CLINICAL TO FILL OR ROUTE PER PROTOCOLS)

## 2021-12-23 NOTE — Telephone Encounter (Signed)
Patient is aware to hold on the xarelto. She also wanted to know if there was a med that was going to be sent in for thrush? She stated it was discussed during the visit

## 2021-12-23 NOTE — Telephone Encounter (Signed)
Drug interaction with Paxlovid and Xarelto. Please advise

## 2021-12-23 NOTE — Telephone Encounter (Signed)
Pt would like a pill for thrush. Please advise
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
## 2021-12-23 NOTE — Telephone Encounter (Signed)
She can hold xarelto until done with paxlovid  Thanks,  Luan Pulling

## 2021-12-23 NOTE — Addendum Note (Signed)
Addended by: Janeece Agee on: 12/23/2021 12:14 PM   Modules accepted: Orders

## 2021-12-23 NOTE — Telephone Encounter (Signed)
Caller states [facility] about [facility] on an [facility] received. Pt is Brenda Johnston, DOB is [birth year]. Callback number is [phone]. Caller wanted to send a message. Caller name is Maureen Ralphs B. It is a reaction with Paxlovid and Serelto   Additional Comment Office hours provided. Caller wanted to send a message.

## 2021-12-24 ENCOUNTER — Other Ambulatory Visit: Payer: Self-pay | Admitting: Registered Nurse

## 2021-12-24 DIAGNOSIS — B370 Candidal stomatitis: Secondary | ICD-10-CM

## 2021-12-24 MED ORDER — FLUCONAZOLE 150 MG PO TABS: 150.0000 mg | ORAL_TABLET | ORAL | 2 refills | Status: DC

## 2021-12-24 NOTE — Telephone Encounter (Signed)
Sent Thanks Rich

## 2021-12-29 ENCOUNTER — Ambulatory Visit: Payer: BC Managed Care – PPO | Admitting: Registered Nurse

## 2021-12-29 ENCOUNTER — Encounter: Payer: Self-pay | Admitting: Registered Nurse

## 2021-12-29 ENCOUNTER — Telehealth: Payer: Self-pay | Admitting: Registered Nurse

## 2021-12-29 ENCOUNTER — Other Ambulatory Visit: Payer: Self-pay | Admitting: Registered Nurse

## 2021-12-29 VITALS — BP 122/76 | HR 91 | Temp 98.4°F | Resp 15 | Ht 68.0 in | Wt 158.8 lb

## 2021-12-29 DIAGNOSIS — R1030 Lower abdominal pain, unspecified: Secondary | ICD-10-CM

## 2021-12-29 DIAGNOSIS — J449 Chronic obstructive pulmonary disease, unspecified: Secondary | ICD-10-CM

## 2021-12-29 DIAGNOSIS — U071 COVID-19: Secondary | ICD-10-CM

## 2021-12-29 LAB — POCT URINALYSIS DIP (MANUAL ENTRY)
Bilirubin, UA: NEGATIVE
Glucose, UA: NEGATIVE mg/dL
Ketones, POC UA: NEGATIVE mg/dL
Leukocytes, UA: NEGATIVE
Nitrite, UA: NEGATIVE
Protein Ur, POC: NEGATIVE mg/dL
Spec Grav, UA: 1.01 (ref 1.010–1.025)
Urobilinogen, UA: 0.2 E.U./dL
pH, UA: 6 (ref 5.0–8.0)

## 2021-12-29 MED ORDER — CYCLOBENZAPRINE HCL 10 MG PO TABS: 10.0000 mg | ORAL_TABLET | Freq: Three times a day (TID) | ORAL | 0 refills | Status: DC | PRN

## 2021-12-29 MED ORDER — FLUTICASONE-SALMETEROL 232-14 MCG/ACT IN AEPB: 1.0000 | INHALATION_SPRAY | Freq: Two times a day (BID) | RESPIRATORY_TRACT | 3 refills | Status: DC

## 2021-12-29 MED ORDER — QVAR REDIHALER 80 MCG/ACT IN AERB: 2.0000 | INHALATION_SPRAY | Freq: Two times a day (BID) | RESPIRATORY_TRACT | 2 refills | Status: DC

## 2021-12-29 MED ORDER — MONTELUKAST SODIUM 10 MG PO TABS: 10.0000 mg | ORAL_TABLET | Freq: Every day | ORAL | 3 refills | Status: DC

## 2021-12-29 NOTE — Progress Notes (Signed)
MH

## 2021-12-29 NOTE — Telephone Encounter (Signed)
Patient inhaler was going to cost her 343.00. patient needs something cheaper.
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
## 2021-12-29 NOTE — Patient Instructions (Signed)
Ms. Brenda Johnston to see you.  Stop flovent. Start qvar. Start montelukast before bed nightly.  I have referred to pulmonary if those don't work  Take flexeril if pain recurs  I'll call if urine culture shows anything  Thank you  Luan Pulling

## 2021-12-29 NOTE — Telephone Encounter (Signed)
Pt called in stating that the Qvar inhaler was going to cost her $340, she states she need something cheaper sent in for her.  Please advise

## 2021-12-29 NOTE — Telephone Encounter (Signed)
Sent fluticasone-salmeterol  Thanks,  Writer

## 2021-12-31 LAB — URINE CULTURE
MICRO NUMBER:: 12824601
Result:: NO GROWTH
SPECIMEN QUALITY:: ADEQUATE

## 2022-04-02 ENCOUNTER — Other Ambulatory Visit: Payer: Self-pay | Admitting: Registered Nurse

## 2022-04-02 DIAGNOSIS — E039 Hypothyroidism, unspecified: Secondary | ICD-10-CM

## 2022-04-02 NOTE — Telephone Encounter (Signed)
Patient is requesting a refill of the following medications: ?Requested Prescriptions  ? ?Pending Prescriptions Disp Refills  ? levothyroxine (SYNTHROID) 112 MCG tablet [Pharmacy Med Name: LEVOTHYROXINE 112 MCG TABLET] 90 tablet 1  ?  Sig: TAKE 1 TABLET BY MOUTH EVERY DAY  ? cyclobenzaprine (FLEXERIL) 10 MG tablet [Pharmacy Med Name: CYCLOBENZAPRINE 10 MG TABLET] 30 tablet 0  ?  Sig: TAKE 1 TABLET BY MOUTH THREE TIMES A DAY AS NEEDED FOR MUSCLE SPASMS  ? ? ?Date of patient request: 04/02/22 ?Last office visit: 12/29/21 ?Date of last refill: 12/29/21 ?Last refill amount: 30  ?

## 2022-06-28 ENCOUNTER — Other Ambulatory Visit: Payer: Self-pay | Admitting: Registered Nurse

## 2022-09-16 ENCOUNTER — Other Ambulatory Visit (INDEPENDENT_AMBULATORY_CARE_PROVIDER_SITE_OTHER): Payer: BC Managed Care – PPO

## 2022-09-16 ENCOUNTER — Encounter: Payer: Self-pay | Admitting: Family

## 2022-09-16 ENCOUNTER — Ambulatory Visit: Payer: BC Managed Care – PPO | Admitting: Family

## 2022-09-16 VITALS — BP 122/66 | HR 74 | Temp 98.6°F | Resp 16 | Ht 68.0 in | Wt 158.4 lb

## 2022-09-16 DIAGNOSIS — Z87442 Personal history of urinary calculi: Secondary | ICD-10-CM

## 2022-09-16 DIAGNOSIS — J449 Chronic obstructive pulmonary disease, unspecified: Secondary | ICD-10-CM

## 2022-09-16 DIAGNOSIS — R739 Hyperglycemia, unspecified: Secondary | ICD-10-CM

## 2022-09-16 DIAGNOSIS — J3489 Other specified disorders of nose and nasal sinuses: Secondary | ICD-10-CM

## 2022-09-16 DIAGNOSIS — F411 Generalized anxiety disorder: Secondary | ICD-10-CM | POA: Diagnosis not present

## 2022-09-16 DIAGNOSIS — E785 Hyperlipidemia, unspecified: Secondary | ICD-10-CM | POA: Insufficient documentation

## 2022-09-16 DIAGNOSIS — E039 Hypothyroidism, unspecified: Secondary | ICD-10-CM

## 2022-09-16 DIAGNOSIS — U071 COVID-19: Secondary | ICD-10-CM

## 2022-09-16 DIAGNOSIS — D582 Other hemoglobinopathies: Secondary | ICD-10-CM | POA: Insufficient documentation

## 2022-09-16 DIAGNOSIS — F410 Panic disorder [episodic paroxysmal anxiety] without agoraphobia: Secondary | ICD-10-CM

## 2022-09-16 DIAGNOSIS — T485X5A Adverse effect of other anti-common-cold drugs, initial encounter: Secondary | ICD-10-CM

## 2022-09-16 DIAGNOSIS — J309 Allergic rhinitis, unspecified: Secondary | ICD-10-CM

## 2022-09-16 DIAGNOSIS — K219 Gastro-esophageal reflux disease without esophagitis: Secondary | ICD-10-CM

## 2022-09-16 DIAGNOSIS — J310 Chronic rhinitis: Secondary | ICD-10-CM

## 2022-09-16 LAB — CBC
HCT: 41 % (ref 36.0–46.0)
Hemoglobin: 13.9 g/dL (ref 12.0–15.0)
MCHC: 33.9 g/dL (ref 30.0–36.0)
MCV: 93.6 fl (ref 78.0–100.0)
Platelets: 201 10*3/uL (ref 150.0–400.0)
RBC: 4.38 Mil/uL (ref 3.87–5.11)
RDW: 12.5 % (ref 11.5–15.5)
WBC: 6 10*3/uL (ref 4.0–10.5)

## 2022-09-16 LAB — LIPID PANEL
Cholesterol: 197 mg/dL (ref 0–200)
HDL: 49 mg/dL (ref >39.00)
LDL Cholesterol: 126 mg/dL — ABNORMAL HIGH (ref 0–99)
NonHDL: 147.53
Total CHOL/HDL Ratio: 4
Triglycerides: 107 mg/dL (ref 0.0–149.0)
VLDL: 21.4 mg/dL (ref 0.0–40.0)

## 2022-09-16 MED ORDER — MONTELUKAST SODIUM 10 MG PO TABS: 10.0000 mg | ORAL_TABLET | Freq: Every day | ORAL | 3 refills | Status: DC

## 2022-09-16 MED ORDER — PANTOPRAZOLE SODIUM 20 MG PO TBEC: 20.0000 mg | DELAYED_RELEASE_TABLET | Freq: Every day | ORAL | 0 refills | Status: DC

## 2022-09-16 MED ORDER — METHYLPREDNISOLONE 4 MG PO TBPK: ORAL_TABLET | ORAL | 0 refills | Status: DC

## 2022-09-16 MED ORDER — BUSPIRONE HCL 7.5 MG PO TABS: 7.5000 mg | ORAL_TABLET | Freq: Two times a day (BID) | ORAL | 3 refills | Status: DC
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
## 2022-09-16 NOTE — Patient Instructions (Addendum)
  Start Singulair once daily.  Start Flonase daily.  Consider taking nightly zyrtec.   Going to a steroid trial pack to get you off of the afrin.   Add fiber supplement once daily.  Add a probiotic (such as Florastor) daily. Drink 64 oz of water a day. Eat lots of fresh fruit and veggies. Ensure regular exercise.    If you are not able to have regular BM's with the above regimen, you may add miralax 1 tablespoon daily.  Increase or decrease amount/frequency as needed to ensure 1 soft BM/day.   Sending in pantoprazole once daily for you.   Welcome to our clinic, I am happy to have you as my new patient. I am excited to continue on this healthcare journey with you.  Stop by the lab prior to leaving today. I will notify you of your results once received.   Please keep in mind Any my chart messages you send have up to a three business day turnaround for a response.  Phone calls may take up to a one full business day turnaround for a  response.   If you need a medication refill I recommend you request it through the pharmacy as this is easiest for [place] rather than sending a message and or phone call.   Due to recent changes in healthcare laws, you may see results of your imaging and/or laboratory studies on MyChart before I have had a chance to review them.  I understand that in some cases there may be results that are confusing or concerning to you. Please understand that not all results are received at the same time and often I may need to interpret multiple results in order to provide you with the best plan of care or course of treatment. Therefore, I ask that you please give me 2 business days to thoroughly review all your results before contacting my office for clarification. Should we see a critical lab result, you will be contacted sooner.   It was a pleasure seeing you today! Please do not hesitate to reach out with any questions and or concerns.  Regards,   Eugenia Pancoast FNP-C

## 2022-09-17 ENCOUNTER — Other Ambulatory Visit: Payer: Self-pay | Admitting: Family Medicine

## 2022-09-17 DIAGNOSIS — T485X5A Adverse effect of other anti-common-cold drugs, initial encounter: Secondary | ICD-10-CM | POA: Insufficient documentation

## 2022-09-17 DIAGNOSIS — U071 COVID-19: Secondary | ICD-10-CM | POA: Insufficient documentation

## 2022-09-17 DIAGNOSIS — J309 Allergic rhinitis, unspecified: Secondary | ICD-10-CM | POA: Insufficient documentation

## 2022-09-17 DIAGNOSIS — J449 Chronic obstructive pulmonary disease, unspecified: Secondary | ICD-10-CM | POA: Insufficient documentation

## 2022-09-17 DIAGNOSIS — J310 Chronic rhinitis: Secondary | ICD-10-CM | POA: Insufficient documentation

## 2022-09-17 DIAGNOSIS — J3489 Other specified disorders of nose and nasal sinuses: Secondary | ICD-10-CM | POA: Insufficient documentation

## 2022-09-17 LAB — TSH: TSH: 1.86 u[IU]/mL (ref 0.35–5.50)

## 2022-09-17 NOTE — Assessment & Plan Note (Signed)
Suggest pt restart singulair 10 mg nightly.

## 2022-09-17 NOTE — Assessment & Plan Note (Signed)
Refill buspirone 7.5 mg p bid did advise pt not to take prn but to take twice daily as instructed for best anxiety maintenance.

## 2022-09-17 NOTE — Assessment & Plan Note (Signed)
Repeat cbc pending results 

## 2022-09-17 NOTE — Assessment & Plan Note (Signed)
Ordering tsh pending results  Continue levothyroxine 112 mcg once daily

## 2022-09-17 NOTE — Assessment & Plan Note (Signed)
Trial to get pt off of afrin, as addictive and will continue to keep pt with sinus congestion while taking.  rx medrol dose pack, stop afrin, start using flonase 50 mcg once daily and zyrtec nightly. Try this regimen to d/c daily afrin use.

## 2022-09-17 NOTE — Assessment & Plan Note (Signed)
Ordering a1c pending results Pt advised of the following: Work on a diabetic diet, try to incorporate exercise at least 20-30 a day for 3 days a week or more.   

## 2022-09-17 NOTE — Assessment & Plan Note (Signed)
Ordered lipid panel, pending results. Work on low cholesterol diet and exercise as tolerated ? ?

## 2022-09-17 NOTE — Assessment & Plan Note (Signed)
trial pantoprazole 20 mg for better cost than omeprazole once daily.  Try to decrease and or avoid spicy foods, fried fatty foods, and also caffeine and chocolate as these can increase heartburn symptoms.

## 2022-09-20 LAB — HEMOGLOBIN A1C: Hgb A1c MFr Bld: 5.9 % (ref 4.6–6.5)

## 2022-09-27 ENCOUNTER — Telehealth: Payer: Self-pay | Admitting: Family

## 2022-09-27 ENCOUNTER — Other Ambulatory Visit: Payer: Self-pay

## 2022-09-27 DIAGNOSIS — M419 Scoliosis, unspecified: Secondary | ICD-10-CM

## 2022-09-27 DIAGNOSIS — E039 Hypothyroidism, unspecified: Secondary | ICD-10-CM

## 2022-09-27 NOTE — Telephone Encounter (Signed)
  Encourage patient to contact the pharmacy for refills or they can request refills through [facility]  Did the patient contact the pharmacy:  NO   LAST APPOINTMENT DATE:  Please schedule appointment if longer than 1 year  - 09/16/22  NEXT APPOINTMENT DATE:  MEDICATION: cyclobenzaprine (FLEXERIL) 10 MG tablet , levothyroxine (SYNTHROID) 112 MCG tablet  Is the patient out of medication? NO  If not, how much is left? A week supply   Is this a 90 day supply: YES  PHARMACY: [facility], [address]   Let patient know to contact pharmacy at the end of the day to make sure medication is ready.  Please notify patient to allow 48-72 hours to process

## 2022-09-27 NOTE — Telephone Encounter (Signed)
RX sent to provider

## 2022-09-28 MED ORDER — LEVOTHYROXINE SODIUM 112 MCG PO TABS: 112.0000 ug | ORAL_TABLET | Freq: Every day | ORAL | 1 refills | Status: DC

## 2022-09-29 DIAGNOSIS — M419 Scoliosis, unspecified: Secondary | ICD-10-CM | POA: Insufficient documentation

## 2022-09-29 MED ORDER — CYCLOBENZAPRINE HCL 10 MG PO TABS: ORAL_TABLET | ORAL | 0 refills | Status: AC

## 2022-09-29 NOTE — Telephone Encounter (Signed)
Pt wondering why the flexeril was not filled.

## 2022-09-29 NOTE — Addendum Note (Signed)
Addended by: Eugenia Pancoast on: 09/29/2022 11:37 AM   Modules accepted: Orders

## 2022-09-29 NOTE — Telephone Encounter (Signed)
Pt called stating she received the refill for levothyroxine (SYNTHROID) 112 MCG tablet but not for cyclobenzaprine (FLEXERIL) 10 MG tablet. Pt was wondering was it refill? Call back # [phone]

## 2022-10-01 DIAGNOSIS — M545 Low back pain, unspecified: Secondary | ICD-10-CM | POA: Diagnosis not present

## 2022-10-21 DIAGNOSIS — F411 Generalized anxiety disorder: Secondary | ICD-10-CM | POA: Diagnosis not present

## 2022-10-21 DIAGNOSIS — M546 Pain in thoracic spine: Secondary | ICD-10-CM | POA: Diagnosis not present

## 2022-10-21 DIAGNOSIS — M545 Low back pain, unspecified: Secondary | ICD-10-CM | POA: Diagnosis not present

## 2022-10-21 DIAGNOSIS — R319 Hematuria, unspecified: Secondary | ICD-10-CM | POA: Diagnosis not present

## 2022-11-07 DIAGNOSIS — M545 Low back pain, unspecified: Secondary | ICD-10-CM | POA: Diagnosis not present

## 2022-11-22 DIAGNOSIS — M545 Low back pain, unspecified: Secondary | ICD-10-CM | POA: Diagnosis not present

## 2023-02-28 ENCOUNTER — Ambulatory Visit: Payer: BC Managed Care – PPO | Admitting: Family

## 2023-02-28 ENCOUNTER — Other Ambulatory Visit: Payer: Self-pay | Admitting: Family

## 2023-02-28 ENCOUNTER — Encounter: Payer: Self-pay | Admitting: Family

## 2023-02-28 VITALS — BP 142/90 | HR 77 | Temp 97.6°F | Ht 68.0 in | Wt 163.8 lb

## 2023-02-28 DIAGNOSIS — E785 Hyperlipidemia, unspecified: Secondary | ICD-10-CM | POA: Diagnosis not present

## 2023-02-28 DIAGNOSIS — R739 Hyperglycemia, unspecified: Secondary | ICD-10-CM | POA: Diagnosis not present

## 2023-02-28 DIAGNOSIS — U071 COVID-19: Secondary | ICD-10-CM

## 2023-02-28 DIAGNOSIS — K219 Gastro-esophageal reflux disease without esophagitis: Secondary | ICD-10-CM | POA: Diagnosis not present

## 2023-02-28 DIAGNOSIS — Z91148 Patient's other noncompliance with medication regimen for other reason: Secondary | ICD-10-CM

## 2023-02-28 DIAGNOSIS — J449 Chronic obstructive pulmonary disease, unspecified: Secondary | ICD-10-CM

## 2023-02-28 DIAGNOSIS — R42 Dizziness and giddiness: Secondary | ICD-10-CM

## 2023-02-28 DIAGNOSIS — F410 Panic disorder [episodic paroxysmal anxiety] without agoraphobia: Secondary | ICD-10-CM

## 2023-02-28 DIAGNOSIS — F411 Generalized anxiety disorder: Secondary | ICD-10-CM

## 2023-02-28 DIAGNOSIS — E039 Hypothyroidism, unspecified: Secondary | ICD-10-CM | POA: Diagnosis not present

## 2023-02-28 DIAGNOSIS — J310 Chronic rhinitis: Secondary | ICD-10-CM

## 2023-02-28 DIAGNOSIS — J441 Chronic obstructive pulmonary disease with (acute) exacerbation: Secondary | ICD-10-CM

## 2023-02-28 DIAGNOSIS — T485X5A Adverse effect of other anti-common-cold drugs, initial encounter: Secondary | ICD-10-CM

## 2023-02-28 LAB — LIPID PANEL
Cholesterol: 211 mg/dL — ABNORMAL HIGH (ref 0–200)
HDL: 49.4 mg/dL (ref >39.00)
LDL Cholesterol: 133 mg/dL — ABNORMAL HIGH (ref 0–99)
NonHDL: 161.12
Total CHOL/HDL Ratio: 4
Triglycerides: 139 mg/dL (ref 0.0–149.0)
VLDL: 27.8 mg/dL (ref 0.0–40.0)

## 2023-02-28 LAB — CBC
HCT: 44.3 % (ref 36.0–46.0)
Hemoglobin: 15 g/dL (ref 12.0–15.0)
MCHC: 33.8 g/dL (ref 30.0–36.0)
MCV: 94.2 fl (ref 78.0–100.0)
Platelets: 251 10*3/uL (ref 150.0–400.0)
RBC: 4.7 Mil/uL (ref 3.87–5.11)
RDW: 13 % (ref 11.5–15.5)
WBC: 6.5 10*3/uL (ref 4.0–10.5)

## 2023-02-28 LAB — TSH: TSH: 1.33 u[IU]/mL (ref 0.35–5.50)

## 2023-02-28 MED ORDER — LANSOPRAZOLE 15 MG PO CPDR: 15.0000 mg | DELAYED_RELEASE_CAPSULE | Freq: Every day | ORAL | 1 refills | Status: DC

## 2023-02-28 MED ORDER — MONTELUKAST SODIUM 10 MG PO TABS: 10.0000 mg | ORAL_TABLET | Freq: Every day | ORAL | 3 refills | Status: DC

## 2023-02-28 MED ORDER — BUSPIRONE HCL 7.5 MG PO TABS: 7.5000 mg | ORAL_TABLET | Freq: Two times a day (BID) | ORAL | 3 refills | Status: DC

## 2023-02-28 NOTE — Patient Instructions (Addendum)
  You can download good rx on your phone and use in place of insurance at your pharmacy.  Increase buspirone 7.5 mg to twice daily.   Consider restarting singulair 10 mg this will help with allergies and breathing.  I suggest you get fluticasone over the counter which is over the counter flonase and also start nightly zyrtec to see if you can get off of afrin.  You need to be consistent with these and take daily for at least two weeks to see if they are helpful.   Due to recent changes in healthcare laws, you may see results of your imaging and/or laboratory studies on MyChart before I have had a chance to review them.  I understand that in some cases there may be results that are confusing or concerning to you. Please understand that not all results are received at the same time and often I may need to interpret multiple results in order to provide you with the best plan of care or course of treatment. Therefore, I ask that you please give me 2 business days to thoroughly review all your results before contacting my office for clarification. Should we see a critical lab result, you will be contacted sooner.   It was a pleasure seeing you today! Please do not hesitate to reach out with any questions and or concerns.  Regards,   Eugenia Pancoast FNP-C

## 2023-02-28 NOTE — Progress Notes (Unsigned)
Established Patient Office Visit  Subjective:      CC:  Chief Complaint  Patient presents with   Thyroid Problem   Anxiety    HPI: Raye Ericksen is a 57 y.o. female presenting on 02/28/2023 for Thyroid Problem and Anxiety . COPD: didn't restart the singuair 10 mg nightly, however states wasn't helpful. She did try prednisone but also couldn't take it because was giving her terrible headaches.   Rhinitis medicamentosa: still on afrin, states flonase started but only did it for 2-3 days and found it wasn't helpful.   New complaints: States felt weird, and then after eating she felt nausea and dizzy. She states when she sticks to little meals she is a little bit better. Sometimes if she eats something heavy like a steak she will feel as though it gets stuck in her throat and then she will quit eating. Otherwise she will also feel this with steak and then will start burping.   Anxiety: worse in the last few months.     02/28/2023    9:33 AM 07/07/2020    1:47 PM 05/12/2018   10:17 AM 03/24/2018    2:43 PM  GAD 7 : Generalized Anxiety Score  Nervous, Anxious, on Edge '3 3 1 3  '$ Control/stop worrying '3 3 3 3  '$ Worry too much - different things '3 3 3 3  '$ Trouble relaxing '3 3 2 3  '$ Restless '3 3 2 3  '$ Easily annoyed or irritable 0 '3 1 3  '$ Afraid - awful might happen '3 3 1 3  '$ Total GAD 7 Score '18 21 13 21  '$ Anxiety Difficulty Somewhat difficult Somewhat difficult Somewhat difficult Extremely difficult       02/28/2023    9:33 AM 12/29/2021   11:34 AM 12/23/2021   11:31 AM  PHQ9 SCORE ONLY  PHQ-9 Total Score 17 7 0      Social history:  Relevant past medical, surgical, family and social history reviewed and updated as indicated. Interim medical history since our last visit reviewed.  Allergies and medications reviewed and updated.  DATA REVIEWED: CHART IN EPIC     ROS: Negative unless specifically indicated above in HPI.    Current Outpatient Medications:    albuterol (VENTOLIN  HFA) 108 (90 Base) MCG/ACT inhaler, Inhale 2 puffs into the lungs every 4 (four) hours as needed for wheezing or shortness of breath., Disp: 8 g, Rfl: 0   ALPRAZolam (XANAX) 0.25 MG tablet, Take 1 tablet (0.25 mg total) by mouth 2 (two) times daily as needed for anxiety., Disp: 20 tablet, Rfl: 0   cyclobenzaprine (FLEXERIL) 10 MG tablet, TAKE 1 TABLET BY MOUTH THREE TIMES A DAY AS NEEDED FOR MUSCLE SPASMS, Disp: 20 tablet, Rfl: 0   levothyroxine (SYNTHROID) 112 MCG tablet, Take 1 tablet (112 mcg total) by mouth daily., Disp: 90 tablet, Rfl: 1   busPIRone (BUSPAR) 7.5 MG tablet, Take 1 tablet (7.5 mg total) by mouth 2 (two) times daily., Disp: 270 tablet, Rfl: 3   montelukast (SINGULAIR) 10 MG tablet, Take 1 tablet (10 mg total) by mouth at bedtime., Disp: 90 tablet, Rfl: 3   pantoprazole (PROTONIX) 20 MG tablet, Take 1 tablet (20 mg total) by mouth daily., Disp: 90 tablet, Rfl: 0      Objective:    BP (!) 142/90 Comment: left arm  Pulse 77   Temp 97.6 F (36.4 C) (Temporal)   Ht '5\' 8"'$  (1.727 m)   Wt 163 lb 12.8 oz (74.3 kg)  SpO2 98%   BMI 24.91 kg/m   Wt Readings from Last 3 Encounters:  02/28/23 163 lb 12.8 oz (74.3 kg)  09/16/22 158 lb 6 oz (71.8 kg)  12/29/21 158 lb 12.8 oz (72 kg)    Physical Exam  Physical Exam Constitutional:      General: not in acute distress.    Appearance: Normal appearance. normal weight. is not ill-appearing, toxic-appearing or diaphoretic.  Cardiovascular:     Rate and Rhythm: Normal rate.  Pulmonary:     Effort: Pulmonary effort is normal.  Musculoskeletal:        General: Normal range of motion.  Neurological:     General: No focal deficit present.     Mental Status: alert and oriented to person, place, and time. Mental status is at baseline.  Psychiatric:        Mood and Affect: Mood normal.        Behavior: Behavior normal.        Thought Content: Thought content normal.        Judgment: Judgment normal.        Assessment & Plan:   Gastroesophageal reflux disease, unspecified whether esophagitis present  Hypothyroidism, unspecified type Assessment & Plan: Tsh ordered today . Continue levothyroxine as prescribed.  Orders: -     TSH  Hyperglycemia -     Hemoglobin A1c; Future  Elevated lipids -     Lipid panel  Dizziness -     CBC  Chronic obstructive pulmonary disease, unspecified COPD type (HCC) -     Montelukast Sodium; Take 1 tablet (10 mg total) by mouth at bedtime.  Dispense: 90 tablet; Refill: 3  Generalized anxiety disorder with panic attacks Assessment & Plan: Increase buspirone to 7.5 mg twice daily from once daily. Work on anxiety reducing techniques.  Orders: -     busPIRone HCl; Take 1 tablet (7.5 mg total) by mouth 2 (two) times daily.  Dispense: 270 tablet; Refill: 3  Non compliance w medication regimen  Acute exacerbation of chronic obstructive airways disease (HCC) Assessment & Plan: Refilled singulair  Recommended pt start this medication as she had not started, and stated this will help with bronchodilation    Rhinitis medicamentosa Assessment & Plan: Again discussed with patients options to try to d/c afrin as this is causing rebound affect. Recommendation for two weeks flonase and zyrtec nightly, and to stop afrin.       Return in about 3 months (around 05/31/2023).  Eugenia Pancoast, MSN, APRN, FNP-C [place]

## 2023-03-01 ENCOUNTER — Encounter: Payer: Self-pay | Admitting: Family

## 2023-03-01 DIAGNOSIS — Z91148 Patient's other noncompliance with medication regimen for other reason: Secondary | ICD-10-CM | POA: Insufficient documentation

## 2023-03-01 NOTE — Assessment & Plan Note (Signed)
Increase buspirone to 7.5 mg twice daily from once daily. Work on anxiety reducing techniques.

## 2023-03-01 NOTE — Assessment & Plan Note (Signed)
Again discussed with patients options to try to d/c afrin as this is causing rebound affect. Recommendation for two weeks flonase and zyrtec nightly, and to stop afrin.

## 2023-03-01 NOTE — Assessment & Plan Note (Signed)
Refilled singulair  Recommended pt start this medication as she had not started, and stated this will help with bronchodilation

## 2023-03-01 NOTE — Assessment & Plan Note (Signed)
Tsh ordered today . Continue levothyroxine as prescribed.

## 2023-03-02 ENCOUNTER — Telehealth: Payer: Self-pay | Admitting: Family

## 2023-03-02 ENCOUNTER — Other Ambulatory Visit: Payer: Self-pay | Admitting: Family

## 2023-03-02 DIAGNOSIS — F410 Panic disorder [episodic paroxysmal anxiety] without agoraphobia: Secondary | ICD-10-CM

## 2023-03-02 DIAGNOSIS — E039 Hypothyroidism, unspecified: Secondary | ICD-10-CM

## 2023-03-02 NOTE — Telephone Encounter (Signed)
Pt called in stated pharmacy did not receive RX busPIRone (BUSPAR) 7.5 MG tablet  refill wants to know the status requesting a call back #   [phone]

## 2023-03-03 MED ORDER — BUSPIRONE HCL 7.5 MG PO TABS: 7.5000 mg | ORAL_TABLET | Freq: Two times a day (BID) | ORAL | 1 refills | Status: AC

## 2023-03-03 NOTE — Telephone Encounter (Signed)
Spoke with Ebony Hail at the [facility]. They don't have an updated rx, but would take a verbal. Please clarify if patient is to take 1 tablet 2 times daily or 1 tablet three times daily. Dispense 270 tablets would be 3 times daily or can send a new rx for 2 times daily to dispense 180 tablets.

## 2023-04-22 ENCOUNTER — Telehealth: Payer: Self-pay

## 2023-04-22 NOTE — Telephone Encounter (Signed)
PA approved.   Approved. Authorization Expiration Date: 04/21/2024

## 2023-04-22 NOTE — Telephone Encounter (Signed)
PA initiated via Covermymeds; KEY: [id number]. Awaiting determination.

## 2023-06-07 DIAGNOSIS — D0462 Carcinoma in situ of skin of left upper limb, including shoulder: Secondary | ICD-10-CM | POA: Diagnosis not present

## 2023-06-07 DIAGNOSIS — C44722 Squamous cell carcinoma of skin of right lower limb, including hip: Secondary | ICD-10-CM | POA: Diagnosis not present

## 2023-06-07 DIAGNOSIS — D0361 Melanoma in situ of right upper limb, including shoulder: Secondary | ICD-10-CM | POA: Diagnosis not present

## 2023-06-21 DIAGNOSIS — X32XXXD Exposure to sunlight, subsequent encounter: Secondary | ICD-10-CM | POA: Diagnosis not present

## 2023-06-21 DIAGNOSIS — L570 Actinic keratosis: Secondary | ICD-10-CM | POA: Diagnosis not present

## 2023-06-21 DIAGNOSIS — D0359 Melanoma in situ of other part of trunk: Secondary | ICD-10-CM | POA: Diagnosis not present

## 2023-07-06 DIAGNOSIS — D0361 Melanoma in situ of right upper limb, including shoulder: Secondary | ICD-10-CM | POA: Diagnosis not present

## 2023-09-05 ENCOUNTER — Ambulatory Visit: Payer: BC Managed Care – PPO | Admitting: Family

## 2023-09-05 ENCOUNTER — Encounter: Payer: Self-pay | Admitting: Family

## 2023-09-05 VITALS — BP 130/86 | HR 63 | Temp 97.7°F | Ht 68.0 in | Wt 159.8 lb

## 2023-09-05 DIAGNOSIS — J449 Chronic obstructive pulmonary disease, unspecified: Secondary | ICD-10-CM

## 2023-09-05 DIAGNOSIS — R42 Dizziness and giddiness: Secondary | ICD-10-CM

## 2023-09-05 DIAGNOSIS — E039 Hypothyroidism, unspecified: Secondary | ICD-10-CM | POA: Diagnosis not present

## 2023-09-05 DIAGNOSIS — Z720 Tobacco use: Secondary | ICD-10-CM | POA: Insufficient documentation

## 2023-09-05 DIAGNOSIS — R739 Hyperglycemia, unspecified: Secondary | ICD-10-CM

## 2023-09-05 DIAGNOSIS — Z87442 Personal history of urinary calculi: Secondary | ICD-10-CM

## 2023-09-05 DIAGNOSIS — E785 Hyperlipidemia, unspecified: Secondary | ICD-10-CM

## 2023-09-05 DIAGNOSIS — F411 Generalized anxiety disorder: Secondary | ICD-10-CM

## 2023-09-05 DIAGNOSIS — F410 Panic disorder [episodic paroxysmal anxiety] without agoraphobia: Secondary | ICD-10-CM

## 2023-09-05 LAB — VITAMIN B12: Vitamin B-12: 233 pg/mL (ref 211–911)

## 2023-09-05 LAB — BASIC METABOLIC PANEL
BUN: 12 mg/dL (ref 6–23)
CO2: 29 meq/L (ref 19–32)
Calcium: 9.5 mg/dL (ref 8.4–10.5)
Chloride: 103 meq/L (ref 96–112)
Creatinine, Ser: 0.95 mg/dL (ref 0.40–1.20)
GFR: 66.77 mL/min (ref >60.00)
Glucose, Bld: 87 mg/dL (ref 70–99)
Potassium: 3.9 meq/L (ref 3.5–5.1)
Sodium: 138 meq/L (ref 135–145)

## 2023-09-05 LAB — LIPID PANEL
Cholesterol: 203 mg/dL — ABNORMAL HIGH (ref 0–200)
HDL: 48.8 mg/dL (ref >39.00)
LDL Cholesterol: 134 mg/dL — ABNORMAL HIGH (ref 0–99)
NonHDL: 153.75
Total CHOL/HDL Ratio: 4
Triglycerides: 100 mg/dL (ref 0.0–149.0)
VLDL: 20 mg/dL (ref 0.0–40.0)

## 2023-09-05 LAB — HEMOGLOBIN A1C: Hgb A1c MFr Bld: 5.8 % (ref 4.6–6.5)

## 2023-09-05 LAB — TSH: TSH: 1.62 u[IU]/mL (ref 0.35–5.50)

## 2023-09-05 NOTE — Progress Notes (Signed)
Established Patient Office Visit  Subjective:      CC:  Chief Complaint  Patient presents with   Hypothyroidism    HPI: Brenda Johnston is a 57 y.o. female presenting on 09/05/2023 for Hypothyroidism . Did have a bout with a kidney stone over the weekend, excruciating back pain and states was on a heating pad. She states she knew it was a kidney stone as she had symptoms she had had prior to having a kidney stone. She states woke up this am and the pain is gone and she is voiding fine. No dysuria. No urinary frequency and or urgency.   She states she would like to check her A1c because in the am only she feels dizzy before eating and once she drinks water and eats she feels better.   GAD with panic attacks, tried to increase to twice daily buspirone 7.5 but was too shaky and so decreased to once daily and states this is working good for her.   COPD: singulair 10 mg once daily and states within a month she had headaches and insomnia, stopped the medication and the symptoms went away. Denies DOE   Melanoma, recently went to dermatologist and they removed , was on right upper chest and right lower shin.   mMRC dyspnea scale, asked in office, on a scale 0-4  1 I get short of breath when hurrying on level ground or walking up a slight hill  Have you had 2 or more moderate exacerbations or at least 1 hospitalization for COPD exacerbation in the past year? No   Does the patient have high peripheral eosinophil levels (>300 ): unknown, will order today.    Social history:  Relevant past medical, surgical, family and social history reviewed and updated as indicated. Interim medical history since our last visit reviewed.  Allergies and medications reviewed and updated.  DATA REVIEWED: CHART IN EPIC     ROS: Negative unless specifically indicated above in HPI.    Current Outpatient Medications:    albuterol (VENTOLIN HFA) 108 (90 Base) MCG/ACT inhaler, Inhale 2 puffs into the lungs  every 4 (four) hours as needed for wheezing or shortness of breath., Disp: 8 g, Rfl: 0   ALPRAZolam (XANAX) 0.25 MG tablet, Take 1 tablet (0.25 mg total) by mouth 2 (two) times daily as needed for anxiety., Disp: 20 tablet, Rfl: 0   busPIRone (BUSPAR) 7.5 MG tablet, Take 7.5 mg by mouth daily., Disp: , Rfl:    cyclobenzaprine (FLEXERIL) 10 MG tablet, TAKE 1 TABLET BY MOUTH THREE TIMES A DAY AS NEEDED FOR MUSCLE SPASMS, Disp: 20 tablet, Rfl: 0   levothyroxine (SYNTHROID) 112 MCG tablet, TAKE 1 TABLET BY MOUTH EVERY DAY, Disp: 90 tablet, Rfl: 1      Objective:    BP 130/86 (BP Location: Left Arm, Patient Position: Sitting, Cuff Size: Normal)   Pulse 63   Temp 97.7 F (36.5 C) (Temporal)   Ht 5\' 8"  (1.727 m)   Wt 159 lb 12.8 oz (72.5 kg)   SpO2 94%   BMI 24.30 kg/m   Wt Readings from Last 3 Encounters:  09/05/23 159 lb 12.8 oz (72.5 kg)  02/28/23 163 lb 12.8 oz (74.3 kg)  09/16/22 158 lb 6 oz (71.8 kg)    Physical Exam Constitutional:      General: She is not in acute distress.    Appearance: Normal appearance. She is normal weight. She is not ill-appearing, toxic-appearing or diaphoretic.  HENT:  Head: Normocephalic.  Cardiovascular:     Rate and Rhythm: Normal rate and regular rhythm.  Pulmonary:     Effort: Pulmonary effort is normal.     Breath sounds: Normal breath sounds. Decreased air movement (slight decreased expansion right lower lobe) present.  Musculoskeletal:        General: Normal range of motion.     Right lower leg: No edema.     Left lower leg: No edema.  Neurological:     General: No focal deficit present.     Mental Status: She is alert and oriented to person, place, and time. Mental status is at baseline.  Psychiatric:        Mood and Affect: Mood normal.        Behavior: Behavior normal.        Thought Content: Thought content normal.        Judgment: Judgment normal.           Assessment & Plan:  Dizziness -     Vitamin B12  Acquired  hypothyroidism Assessment & Plan: Tsh ordered today . Continue levothyroxine as prescribed.  Orders: -     TSH  Hyperglycemia -     Hemoglobin A1c -     Basic metabolic panel  Elevated lipids -     Lipid panel  Tobacco abuse Assessment & Plan: Smoking cessation instruction/counseling given:  counseled patient on the dangers of tobacco use, advised patient to stop smoking, and reviewed strategies to maximize success    Orders: -     Ambulatory Referral for Lung Cancer Scre  Chronic obstructive pulmonary disease, unspecified COPD type (HCC) Assessment & Plan: Controlled per pt.  Failure with singulair.  Slightly decreased breath sound left lower  Stressed to pt that we should set up with lung cancer screening she is hesitant however she states she will have them call and she will ask copayment as she is limited on funds currently.  If any doe and or sob increase then we will order chest xray      Generalized anxiety disorder with panic attacks Assessment & Plan: Stable Continue buspirone 7.5 mg once daily    History of kidney stones Assessment & Plan: Symptoms yesterday, today without symptoms.  Advised to monitor for symptoms let me know if continue later on today.      Return in about 6 months (around 03/04/2024) for f/u CPE.  Mort Sawyers, MSN, APRN, FNP-C [facility]

## 2023-09-05 NOTE — Patient Instructions (Signed)
  Referral to lung cancer screening program made.   Regards,   Mort Sawyers FNP-C

## 2023-09-05 NOTE — Assessment & Plan Note (Signed)
Controlled per pt.  Failure with singulair.  Slightly decreased breath sound left lower  Stressed to pt that we should set up with lung cancer screening she is hesitant however she states she will have them call and she will ask copayment as she is limited on funds currently.  If any doe and or sob increase then we will order chest xray

## 2023-09-05 NOTE — Assessment & Plan Note (Signed)
Stable Continue buspirone 7.5 mg once daily

## 2023-09-05 NOTE — Assessment & Plan Note (Signed)
Tsh ordered today . Continue levothyroxine as prescribed.

## 2023-09-05 NOTE — Assessment & Plan Note (Signed)
Smoking cessation instruction/counseling given:  counseled patient on the dangers of tobacco use, advised patient to stop smoking, and reviewed strategies to maximize success 

## 2023-09-05 NOTE — Assessment & Plan Note (Signed)
Symptoms yesterday, today without symptoms.  Advised to monitor for symptoms let me know if continue later on today.

## 2023-09-06 ENCOUNTER — Other Ambulatory Visit: Payer: Self-pay | Admitting: Family

## 2023-09-06 DIAGNOSIS — F411 Generalized anxiety disorder: Secondary | ICD-10-CM

## 2023-09-06 DIAGNOSIS — E039 Hypothyroidism, unspecified: Secondary | ICD-10-CM

## 2023-12-09 ENCOUNTER — Ambulatory Visit
Admission: RE | Admit: 2023-12-09 | Discharge: 2023-12-09 | Disposition: A | Discharge status: Home / Self Care | Payer: BC Managed Care – PPO | Source: Ambulatory Visit | Attending: Nurse Practitioner | Admitting: Nurse Practitioner

## 2023-12-09 ENCOUNTER — Telehealth: Payer: Self-pay

## 2023-12-09 ENCOUNTER — Other Ambulatory Visit: Payer: Self-pay | Admitting: Family

## 2023-12-09 ENCOUNTER — Ambulatory Visit (INDEPENDENT_AMBULATORY_CARE_PROVIDER_SITE_OTHER): Payer: BC Managed Care – PPO

## 2023-12-09 VITALS — BP 132/86 | HR 73 | Temp 97.9°F | Resp 18

## 2023-12-09 DIAGNOSIS — R1031 Right lower quadrant pain: Secondary | ICD-10-CM | POA: Diagnosis not present

## 2023-12-09 DIAGNOSIS — R3129 Other microscopic hematuria: Secondary | ICD-10-CM

## 2023-12-09 DIAGNOSIS — Z87442 Personal history of urinary calculi: Secondary | ICD-10-CM

## 2023-12-09 LAB — POCT URINALYSIS DIP (MANUAL ENTRY)
Bilirubin, UA: NEGATIVE
Glucose, UA: NEGATIVE mg/dL
Ketones, POC UA: NEGATIVE mg/dL
Nitrite, UA: NEGATIVE
Protein Ur, POC: NEGATIVE mg/dL
Spec Grav, UA: 1.02 (ref 1.010–1.025)
Urobilinogen, UA: 0.2 U/dL
pH, UA: 5.5 (ref 5.0–8.0)

## 2023-12-09 MED ORDER — SULFAMETHOXAZOLE-TRIMETHOPRIM 800-160 MG PO TABS: 1.0000 | ORAL_TABLET | Freq: Two times a day (BID) | ORAL | 0 refills | Status: AC

## 2023-12-09 MED ORDER — ONDANSETRON 4 MG PO TBDP
4.0000 mg | ORAL_TABLET | Freq: Once | ORAL | Status: AC
  Administered 2023-12-09: 4 mg via ORAL

## 2023-12-09 MED ORDER — ONDANSETRON 8 MG PO TBDP: 8.0000 mg | ORAL_TABLET | Freq: Three times a day (TID) | ORAL | 0 refills | Status: AC | PRN

## 2023-12-09 NOTE — Telephone Encounter (Signed)
I spoke with pt; on 12/07/23 had severe abd and back pain with vomiting; yesterday and today no vomiting but feels nauseated and constant rt sided abd pain across from belly button and lower rt back pain that is sharp at times and dull also. Pain level now is 5. Pt said urologist needs referral. Pt has hx of kidney stones. No burning or pain upon urination pt said she does not need to go to ED. No available appts at [facility]; pt declined appt at a different [facility]  office in [place]. Pt scheduled appt with [facility]       12/09/23 at 11:30. UC & ED precautions given and pt voiced understanding. Sending note to Hayden Pedro FNP.

## 2023-12-09 NOTE — ED Provider Notes (Signed)
[facility]    CSN: [id number] Arrival date & time: 12/09/23  1110      History   Chief Complaint Chief Complaint  Patient presents with   Abdominal Pain    hx of kidney stones - Entered by patient   Back Pain   Nausea    HPI Brenda Johnston is a 57 y.o. female.   HPI  She is in today with her spouse for evaluation of right lower quadrant abdominal and right lower back pain.  She reports that she had been experiencing some nausea.  She reports that she has no shortness of breath or chest discomfort.  She does continue to smoke with a history of COPD.  She reports that she has history of kidney stones and did have this evaluated approximately 2 years ago.  She had a history of abdominal pain and few weeks ago which resolved after 1 day.  Past Medical History:  Diagnosis Date   Anxiety    Hypothyroidism    Pre-diabetes    Scoliosis     Patient Active Problem List   Diagnosis Date Noted   Tobacco abuse 09/05/2023   Scoliosis 09/29/2022   Chronic obstructive pulmonary disease (HCC) 09/17/2022   Allergic rhinitis 09/17/2022   Elevated lipids 09/16/2022   History of kidney stones 09/16/2022   Hyperglycemia 09/16/2022   Deep venous thrombosis of lower extremity (HCC) 10/09/2021   Generalized anxiety disorder with panic attacks 07/07/2020   Hypothyroidism 03/30/2019   Gastroesophageal reflux disease 03/30/2019    Past Surgical History:  Procedure Laterality Date   EXTRACORPOREAL SHOCK WAVE LITHOTRIPSY Right 07/20/2021   Procedure: EXTRACORPOREAL SHOCK WAVE LITHOTRIPSY (ESWL);  Surgeon: Sebastian Ache, MD;  Location: [facility];  Service: Urology;  Laterality: Right;    OB History   No obstetric history on file.      Home Medications    Prior to Admission medications   Medication Sig Start Date End Date Taking? Authorizing Provider  busPIRone (BUSPAR) 7.5 MG tablet TAKE 1 TABLET BY MOUTH 2 TIMES DAILY. 09/06/23  Yes Dugal, Wyatt Mage, FNP   levothyroxine (SYNTHROID) 112 MCG tablet TAKE 1 TABLET BY MOUTH EVERY DAY 09/06/23  Yes Dugal, Tabitha, FNP  ondansetron (ZOFRAN-ODT) 8 MG disintegrating tablet Take 1 tablet (8 mg total) by mouth every 8 (eight) hours as needed for up to 15 days for nausea or vomiting. 12/09/23 12/24/23 Yes Barbette Merino, NP  sulfamethoxazole-trimethoprim (BACTRIM DS) 800-160 MG tablet Take 1 tablet by mouth 2 (two) times daily for 5 days. 12/09/23 12/14/23 Yes Barbette Merino, NP  albuterol (VENTOLIN HFA) 108 (90 Base) MCG/ACT inhaler Inhale 2 puffs into the lungs every 4 (four) hours as needed for wheezing or shortness of breath. 11/20/21   Arby Barrette, MD  ALPRAZolam Prudy Feeler) 0.25 MG tablet Take 1 tablet (0.25 mg total) by mouth 2 (two) times daily as needed for anxiety. 09/29/21   Janeece Agee, NP  cyclobenzaprine (FLEXERIL) 10 MG tablet TAKE 1 TABLET BY MOUTH THREE TIMES A DAY AS NEEDED FOR MUSCLE SPASMS 09/29/22   Mort Sawyers, FNP    Family History Family History  Problem Relation Age of Onset   Lung cancer Mother        smoker   Melanoma Mother    Breast cancer Mother    Stroke Father    Diabetes Sister    Hypertension Brother    COPD Maternal Grandmother     Social History Social History   Tobacco Use  Smoking status: Every Day    Current packs/day: 1.00    Average packs/day: 1 pack/day for 40.0 years (40.0 ttl pk-yrs)    Types: Cigarettes   Smokeless tobacco: Never  Vaping Use   Vaping status: Never Used  Substance Use Topics   Alcohol use: Not Currently   Drug use: No     Allergies   Montelukast, Other, Codeine, and Penicillins   Review of Systems Review of Systems   Physical Exam Triage Vital Signs ED Triage Vitals  Encounter Vitals Group     BP 12/09/23 1123 132/86     Systolic BP Percentile --      Diastolic BP Percentile --      Pulse Rate 12/09/23 1123 73     Resp 12/09/23 1123 18     Temp 12/09/23 1123 97.9 F (36.6 C)     Temp Source 12/09/23 1123  Oral     SpO2 12/09/23 1123 98 %     Weight --      Height --      Head Circumference --      Peak Flow --      Pain Score 12/09/23 1122 6     Pain Loc --      Pain Education --      Exclude from Growth Chart --    No data found.  Updated Vital Signs BP 132/86 (BP Location: Right Arm)   Pulse 73   Temp 97.9 F (36.6 C) (Oral)   Resp 18   SpO2 98%   Visual Acuity Right Eye Distance:   Left Eye Distance:   Bilateral Distance:    Right Eye Near:   Left Eye Near:    Bilateral Near:     Physical Exam Constitutional:      General: She is not in acute distress. HENT:     Head: Normocephalic and atraumatic.     Mouth/Throat:     Mouth: Mucous membranes are moist.  Cardiovascular:     Rate and Rhythm: Normal rate and regular rhythm.  Pulmonary:     Effort: Pulmonary effort is normal.  Abdominal:     General: Bowel sounds are decreased. There is no abdominal bruit.     Palpations: Abdomen is soft.     Tenderness: There is abdominal tenderness in the right lower quadrant. There is right CVA tenderness.  Skin:    General: Skin is warm and dry.     Capillary Refill: Capillary refill takes less than 2 seconds.  Neurological:     General: No focal deficit present.     Mental Status: She is alert and oriented to person, place, and time.  Psychiatric:        Mood and Affect: Mood normal.        Behavior: Behavior normal.      UC Treatments / Results  Labs (all labs ordered are listed, but only abnormal results are displayed) Labs Reviewed  POCT URINALYSIS DIP (MANUAL ENTRY) - Abnormal; Notable for the following components:      Result Value   Clarity, UA hazy (*)    Blood, UA moderate (*)    Leukocytes, UA Trace (*)    All other components within normal limits  URINE CULTURE    EKG   Radiology DG Abd 1 View Result Date: 12/09/2023 CLINICAL DATA:  57 year old female with right lower quadrant pain. EXAM: ABDOMEN - 1 VIEW COMPARISON:  CT Abdomen and Pelvis  09/07/2021 and earlier. FINDINGS: Two views of  the abdomen and pelvis 1155 hours. Moderate to severe thoracolumbar scoliosis redemonstrated, levoconvex in the lumbar spine. No abnormal calcifications identified in the abdomen or pelvis. Non obstructed bowel gas pattern. Abdominal visceral contours are within normal limits. Lung bases appears stable, negative. IMPRESSION: 1. No urinary calculus identified radiographically. 2. Nonobstructed bowel-gas pattern. Moderate to severe thoracolumbar scoliosis. Electronically Signed   By: Odessa Fleming M.D.   On: 12/09/2023 13:09    Procedures Procedures (including critical care time)  Medications Ordered in UC Medications  ondansetron (ZOFRAN-ODT) disintegrating tablet 4 mg (4 mg Oral Given 12/09/23 1223)    Initial Impression / Assessment and Plan / UC Course  I have reviewed the triage vital signs and the nursing notes.  Pertinent labs & imaging results that were available during my care of the patient were reviewed by me and considered in my medical decision making (see chart for details).     RLQ pain and nausea Final Clinical Impressions(s) / UC Diagnoses   Final diagnoses:  Microscopic hematuria     Discharge Instructions      Your KUB/xray is pending radiology over read.  Your results from your come to your MyChart.  Any positive results you will be contacted by a nurse for further recommendations and treatment options.  Your urinalysis indicates blood with trace of leukocytes.  You are urine culture is pending this takes about 48 hours to result.  Due to your history and the hematuria we do want to start antibiotic therapy.  You have been prescribed Bactrim 800/160 mg twice daily for 7 days.  You are encouraged to continue to drink plenty of water.  You have been also encouraged to use ibuprofen for your flank pain.  You have been prescribed ondansetron 8 mg every 8 hours as needed for nausea or vomiting.  You have had a referral placed at  [facility] urology for reevaluation.     ED Prescriptions     Medication Sig Dispense Auth. Provider   ondansetron (ZOFRAN-ODT) 8 MG disintegrating tablet Take 1 tablet (8 mg total) by mouth every 8 (eight) hours as needed for up to 15 days for nausea or vomiting. 30 tablet Thad Ranger M, NP   sulfamethoxazole-trimethoprim (BACTRIM DS) 800-160 MG tablet Take 1 tablet by mouth 2 (two) times daily for 5 days. 10 tablet Barbette Merino, NP      PDMP not reviewed this encounter.   Thad Ranger [place], [place] 12/09/23 [phone]

## 2023-12-09 NOTE — Telephone Encounter (Signed)
Noted. Looks like pt has referral for urology from today.

## 2023-12-09 NOTE — Discharge Instructions (Addendum)
Your KUB/xray is pending radiology over read.  Your results from your come to your MyChart.  Any positive results you will be contacted by a nurse for further recommendations and treatment options.  Your urinalysis indicates blood with trace of leukocytes.  You are urine culture is pending this takes about 48 hours to result.  Due to your history and the hematuria we do want to start antibiotic therapy.  You have been prescribed Bactrim 800/160 mg twice daily for 7 days.  You are encouraged to continue to drink plenty of water.  You have been also encouraged to use ibuprofen for your flank pain.  You have been prescribed ondansetron 8 mg every 8 hours as needed for nausea or vomiting.  You have had a referral placed at [facility] urology for reevaluation.

## 2023-12-09 NOTE — Telephone Encounter (Signed)
Called pt x 3 and left v/m for pt to call [facility] for appt and triage. Sending note to Hayden Pedro FNP and Dugal pool.

## 2023-12-09 NOTE — ED Triage Notes (Signed)
Patient states that she's having LRQ pain-back pain and nausea. Since Wednesday. Patient states that she was throwing up Wednesday. Patient states that she had the same issue last month that lasted a day and went away.

## 2023-12-10 LAB — URINE CULTURE: Culture: <10000 — AB

## 2023-12-12 ENCOUNTER — Telehealth (HOSPITAL_COMMUNITY): Payer: Self-pay

## 2023-12-12 DIAGNOSIS — N134 Hydroureter: Secondary | ICD-10-CM | POA: Diagnosis not present

## 2023-12-12 DIAGNOSIS — N132 Hydronephrosis with renal and ureteral calculous obstruction: Secondary | ICD-10-CM | POA: Diagnosis not present

## 2023-12-12 DIAGNOSIS — N201 Calculus of ureter: Secondary | ICD-10-CM | POA: Diagnosis not present

## 2023-12-12 DIAGNOSIS — R109 Unspecified abdominal pain: Secondary | ICD-10-CM | POA: Diagnosis not present

## 2023-12-12 NOTE — Telephone Encounter (Signed)
Pt returned call. Denies any more severe allergic reactions to Bactrim. I had overlooked her urine culture when I sent the previous message. Advised pt to dc abx per protocol. Also advised to f/u with PCP or urology for continued symptoms. Verbalized understanding.

## 2023-12-12 NOTE — Telephone Encounter (Signed)
Pt Brenda Johnston stating she is having a reaction to the Bactrim she was recently started on. States she is having itching. Also reports no improvement in her symptoms. Attempted to reach patient x1 to gather more information. Brenda Johnston.  Please advise of any needed treatment updates.

## 2023-12-22 DIAGNOSIS — R8271 Bacteriuria: Secondary | ICD-10-CM | POA: Diagnosis not present

## 2023-12-22 DIAGNOSIS — N201 Calculus of ureter: Secondary | ICD-10-CM | POA: Diagnosis not present

## 2023-12-23 ENCOUNTER — Other Ambulatory Visit: Payer: Self-pay | Admitting: Urology

## 2023-12-23 ENCOUNTER — Encounter (HOSPITAL_BASED_OUTPATIENT_CLINIC_OR_DEPARTMENT_OTHER): Payer: Self-pay | Admitting: Urology

## 2023-12-23 NOTE — Progress Notes (Signed)
Pt contacted to give LIthotripsy instructions per protocol, to arrive at [facility] at 0645, NPO after MN, bring insurance card and ID (no valuables), Take laxative on Sunday, hydrate well, eat light dinner Sunday evening, bring blue folder, health history and medications reviewed and okay to take usual medications DOS, stop all nonsteroidal anti inflammatories, and pepto bismol as of now. Patient's driver and caregiver will be her spouse. Verbalized understanding of all instructions.

## 2023-12-26 ENCOUNTER — Other Ambulatory Visit: Payer: Self-pay

## 2023-12-26 ENCOUNTER — Ambulatory Visit (HOSPITAL_BASED_OUTPATIENT_CLINIC_OR_DEPARTMENT_OTHER)
Admission: RE | Admit: 2023-12-26 | Discharge: 2023-12-26 | Disposition: A | Discharge status: Home / Self Care | Payer: BC Managed Care – PPO | Source: Ambulatory Visit | Attending: Urology | Admitting: Urology

## 2023-12-26 ENCOUNTER — Encounter (HOSPITAL_BASED_OUTPATIENT_CLINIC_OR_DEPARTMENT_OTHER)
Admission: RE | Disposition: A | Discharge status: Home / Self Care | Payer: Self-pay | Source: Ambulatory Visit | Attending: Urology

## 2023-12-26 ENCOUNTER — Ambulatory Visit (HOSPITAL_BASED_OUTPATIENT_CLINIC_OR_DEPARTMENT_OTHER): Admission: RE | Admit: 2023-12-26 | Payer: BC Managed Care – PPO | Source: Ambulatory Visit | Admitting: Urology

## 2023-12-26 ENCOUNTER — Encounter (HOSPITAL_BASED_OUTPATIENT_CLINIC_OR_DEPARTMENT_OTHER): Admission: RE | Payer: Self-pay | Source: Ambulatory Visit

## 2023-12-26 ENCOUNTER — Ambulatory Visit (HOSPITAL_COMMUNITY): Payer: BC Managed Care – PPO

## 2023-12-26 ENCOUNTER — Encounter (HOSPITAL_BASED_OUTPATIENT_CLINIC_OR_DEPARTMENT_OTHER): Payer: Self-pay | Admitting: Urology

## 2023-12-26 DIAGNOSIS — F419 Anxiety disorder, unspecified: Secondary | ICD-10-CM | POA: Diagnosis not present

## 2023-12-26 DIAGNOSIS — J449 Chronic obstructive pulmonary disease, unspecified: Secondary | ICD-10-CM | POA: Diagnosis not present

## 2023-12-26 DIAGNOSIS — N201 Calculus of ureter: Secondary | ICD-10-CM | POA: Insufficient documentation

## 2023-12-26 DIAGNOSIS — F17200 Nicotine dependence, unspecified, uncomplicated: Secondary | ICD-10-CM | POA: Diagnosis not present

## 2023-12-26 HISTORY — PX: EXTRACORPOREAL SHOCK WAVE LITHOTRIPSY: SHX1557

## 2023-12-26 HISTORY — DX: Chronic obstructive pulmonary disease, unspecified: J44.9

## 2023-12-26 HISTORY — DX: Personal history of urinary calculi: Z87.442

## 2023-12-26 SURGERY — EXTRACORPOREAL SHOCK WAVE LITHOTRIPSY (ESWL)
Anesthesia: LOCAL | Laterality: Right

## 2023-12-26 MED ORDER — DIPHENHYDRAMINE HCL 25 MG PO CAPS
25.0000 mg | ORAL_CAPSULE | ORAL | Status: AC
  Administered 2023-12-26: 25 mg via ORAL

## 2023-12-26 MED ORDER — DIPHENHYDRAMINE HCL 25 MG PO CAPS
ORAL_CAPSULE | ORAL | Status: AC
  Filled 2023-12-26: qty 1

## 2023-12-26 MED ORDER — DIAZEPAM 5 MG PO TABS
10.0000 mg | ORAL_TABLET | ORAL | Status: AC
  Administered 2023-12-26: 10 mg via ORAL

## 2023-12-26 MED ORDER — SODIUM CHLORIDE 0.9 % IV SOLN: INTRAVENOUS | Status: DC

## 2023-12-26 MED ORDER — CIPROFLOXACIN HCL 500 MG PO TABS
500.0000 mg | ORAL_TABLET | ORAL | Status: AC
Start: 2023-12-26 — End: 2023-12-26
  Administered 2023-12-26: 500 mg via ORAL

## 2023-12-26 MED ORDER — DIAZEPAM 5 MG PO TABS
ORAL_TABLET | ORAL | Status: AC
  Filled 2023-12-26: qty 2

## 2023-12-26 MED ORDER — OXYCODONE HCL 5 MG PO TABS
ORAL_TABLET | ORAL | Status: AC
  Filled 2023-12-26: qty 1

## 2023-12-26 MED ORDER — ALFUZOSIN HCL ER 10 MG PO TB24: 10.0000 mg | ORAL_TABLET | Freq: Every day | ORAL | 0 refills | Status: AC

## 2023-12-26 MED ORDER — CIPROFLOXACIN HCL 500 MG PO TABS
ORAL_TABLET | ORAL | Status: AC
  Filled 2023-12-26: qty 1

## 2023-12-26 MED ORDER — OXYCODONE HCL 5 MG PO TABS
5.0000 mg | ORAL_TABLET | Freq: Once | ORAL | Status: AC
  Administered 2023-12-26: 5 mg via ORAL

## 2023-12-26 NOTE — Op Note (Signed)
See [facility] OP note scanned into chart. Also because of the size, density, location and other factors that cannot be anticipated I feel this will likely be a staged procedure. This fact supersedes any indication in the scanned [facility] operative note to the contrary.  

## 2023-12-26 NOTE — H&P (Signed)
Please see scanned HP 

## 2023-12-27 ENCOUNTER — Encounter (HOSPITAL_BASED_OUTPATIENT_CLINIC_OR_DEPARTMENT_OTHER): Payer: Self-pay | Admitting: Urology

## 2024-01-05 ENCOUNTER — Encounter: Payer: Self-pay | Admitting: Family

## 2024-01-09 DIAGNOSIS — N201 Calculus of ureter: Secondary | ICD-10-CM | POA: Diagnosis not present

## 2024-01-09 DIAGNOSIS — R3129 Other microscopic hematuria: Secondary | ICD-10-CM | POA: Diagnosis not present

## 2024-02-27 ENCOUNTER — Encounter: Payer: Self-pay | Admitting: Family

## 2024-02-27 ENCOUNTER — Ambulatory Visit: Payer: BC Managed Care – PPO | Admitting: Family

## 2024-02-27 VITALS — BP 118/76 | HR 75 | Temp 98.1°F | Ht 68.0 in | Wt 151.4 lb

## 2024-02-27 DIAGNOSIS — R7303 Prediabetes: Secondary | ICD-10-CM | POA: Insufficient documentation

## 2024-02-27 DIAGNOSIS — H18891 Other specified disorders of cornea, right eye: Secondary | ICD-10-CM

## 2024-02-27 DIAGNOSIS — E785 Hyperlipidemia, unspecified: Secondary | ICD-10-CM

## 2024-02-27 DIAGNOSIS — Z720 Tobacco use: Secondary | ICD-10-CM | POA: Diagnosis not present

## 2024-02-27 DIAGNOSIS — H1011 Acute atopic conjunctivitis, right eye: Secondary | ICD-10-CM | POA: Insufficient documentation

## 2024-02-27 DIAGNOSIS — E039 Hypothyroidism, unspecified: Secondary | ICD-10-CM | POA: Diagnosis not present

## 2024-02-27 DIAGNOSIS — E538 Deficiency of other specified B group vitamins: Secondary | ICD-10-CM | POA: Diagnosis not present

## 2024-02-27 LAB — LIPID PANEL
Cholesterol: 193 mg/dL (ref 0–200)
HDL: 46.9 mg/dL (ref >39.00)
LDL Cholesterol: 129 mg/dL — ABNORMAL HIGH (ref 0–99)
NonHDL: 146.4
Total CHOL/HDL Ratio: 4
Triglycerides: 89 mg/dL (ref 0.0–149.0)
VLDL: 17.8 mg/dL (ref 0.0–40.0)

## 2024-02-27 LAB — T3, FREE: T3, Free: 2.9 pg/mL (ref 2.3–4.2)

## 2024-02-27 LAB — VITAMIN B12: Vitamin B-12: 221 pg/mL (ref 211–911)

## 2024-02-27 LAB — TSH: TSH: 0.52 u[IU]/mL (ref 0.35–5.50)

## 2024-02-27 LAB — T4, FREE: Free T4: 1.3 ng/dL (ref 0.60–1.60)

## 2024-02-27 LAB — HEMOGLOBIN A1C: Hgb A1c MFr Bld: 6 % (ref 4.6–6.5)

## 2024-02-27 MED ORDER — OLOPATADINE HCL 0.1 % OP SOLN: 1.0000 [drp] | Freq: Two times a day (BID) | OPHTHALMIC | 12 refills | Status: AC

## 2024-02-27 MED ORDER — ERYTHROMYCIN 5 MG/GM OP OINT: 1.0000 | TOPICAL_OINTMENT | Freq: Four times a day (QID) | OPHTHALMIC | 0 refills | Status: AC

## 2024-02-27 NOTE — Assessment & Plan Note (Signed)
 Discussed Pataday over-the-counter drops and also starting Claritin.  Because of the acute sensitivity I am also going to prescribe In case there is a mild chance of corneal abrasion.  I did highly encourage patient to follow-up with her eye doctor however she states the co-pay is currently too high.  I did give her recommendations for dry eye lubrication drops that she can get over-the-counter as well.  Rx sent in for erythromycin 0.5% ophthalmic ointment

## 2024-02-27 NOTE — Patient Instructions (Signed)
  Try to take fluticasone instead of the other nose spray.  Pataday over the counter for your eye will be helpful  Look into systane gel drops, refresh plus or continue with refresh gel. Cover eye at night time to see if that helps with dryness.

## 2024-02-27 NOTE — Progress Notes (Signed)
 Established Patient Office Visit  Subjective:      CC:  Chief Complaint  Patient presents with   Medical Management of Chronic Issues    HPI: Brenda Johnston is a 58 y.o. female presenting on 02/27/2024 for Medical Management of Chronic Issues . Anxiety, doing well on buspirone 7.5 mg twice daily.   Kidney stones, recent lithotripsy. Much improved. Urology as a prn basis.   Tobacco use, declines again lung cancer screening. She will consider in the future.   Wt Readings from Last 3 Encounters:  02/27/24 151 lb 6.4 oz (68.7 kg)  12/26/23 155 lb (70.3 kg)  09/05/23 159 lb 12.8 oz (72.5 kg)   Dry eye, really bad at night time. Saw the ophthalmologist and he gave her eye drops but back to being worse again. This is the right eye worse at night. Eye doctor wanted her to see her primary to do a work up for possible thyroid disease. She has not seen eye doctor since last summer. She states this most recent episode of eye dryness started about one week ago. Feels gritty in her eye, no change in vision, no crusting in the am. She does have increased sensitivity to light over the last one week. She is not taking an antihistamine currently. There is some itchiness in inner canthus. She does also c/o left ear fullness.            Social history:  Relevant past medical, surgical, family and social history reviewed and updated as indicated. Interim medical history since our last visit reviewed.  Allergies and medications reviewed and updated.  DATA REVIEWED: CHART IN EPIC     ROS: Negative unless specifically indicated above in HPI.    Current Outpatient Medications:    erythromycin ophthalmic ointment, Place 1 Application into the left eye 4 (four) times daily for 7 days., Disp: 28 g, Rfl: 0   olopatadine (PATADAY) 0.1 % ophthalmic solution, Place 1 drop into the right eye 2 (two) times daily., Disp: 5 mL, Rfl: 12   albuterol (VENTOLIN HFA) 108 (90 Base) MCG/ACT inhaler, Inhale  2 puffs into the lungs every 4 (four) hours as needed for wheezing or shortness of breath., Disp: 8 g, Rfl: 0   alfuzosin (UROXATRAL) 10 MG 24 hr tablet, Take 1 tablet (10 mg total) by mouth daily with breakfast., Disp: 20 tablet, Rfl: 0   ALPRAZolam (XANAX) 0.25 MG tablet, Take 1 tablet (0.25 mg total) by mouth 2 (two) times daily as needed for anxiety., Disp: 20 tablet, Rfl: 0   busPIRone (BUSPAR) 7.5 MG tablet, TAKE 1 TABLET BY MOUTH 2 TIMES DAILY., Disp: 180 tablet, Rfl: 1   cyclobenzaprine (FLEXERIL) 10 MG tablet, TAKE 1 TABLET BY MOUTH THREE TIMES A DAY AS NEEDED FOR MUSCLE SPASMS, Disp: 20 tablet, Rfl: 0   levothyroxine (SYNTHROID) 112 MCG tablet, TAKE 1 TABLET BY MOUTH EVERY DAY, Disp: 90 tablet, Rfl: 1      Objective:    BP 118/76 (BP Location: Left Arm, Patient Position: Sitting, Cuff Size: Normal)   Pulse 75   Temp 98.1 F (36.7 C) (Temporal)   Ht 5\' 8"  (1.727 m)   Wt 151 lb 6.4 oz (68.7 kg)   SpO2 98%   BMI 23.02 kg/m   Wt Readings from Last 3 Encounters:  02/27/24 151 lb 6.4 oz (68.7 kg)  12/26/23 155 lb (70.3 kg)  09/05/23 159 lb 12.8 oz (72.5 kg)    Physical Exam Constitutional:  General: She is not in acute distress.    Appearance: Normal appearance. She is normal weight. She is not ill-appearing, toxic-appearing or diaphoretic.  HENT:     Head: Normocephalic.     Right Ear: Tympanic membrane is retracted.     Left Ear: Tympanic membrane is retracted.     Nose: Nose normal.     Mouth/Throat:     Mouth: Mucous membranes are dry.     Pharynx: No oropharyngeal exudate or posterior oropharyngeal erythema.  Eyes:     General: Lids are normal. Vision grossly intact. Gaze aligned appropriately. No allergic shiner or visual field deficit.       Right eye: No foreign body, discharge or hordeolum.     Extraocular Movements: Extraocular movements intact.     Right eye: Normal extraocular motion and no nystagmus.     Conjunctiva/sclera:     Right eye: Right  conjunctiva is injected. No exudate or hemorrhage.    Pupils: Pupils are equal, round, and reactive to light.     Comments: Frequent blinking right eye   Cardiovascular:     Rate and Rhythm: Normal rate and regular rhythm.     Pulses: Normal pulses.     Heart sounds: Normal heart sounds.  Pulmonary:     Effort: Pulmonary effort is normal.     Breath sounds: Normal breath sounds.  Musculoskeletal:     Cervical back: Normal range of motion.  Neurological:     General: No focal deficit present.     Mental Status: She is alert and oriented to person, place, and time. Mental status is at baseline.  Psychiatric:        Mood and Affect: Mood normal.        Behavior: Behavior normal.        Thought Content: Thought content normal.        Judgment: Judgment normal.           Assessment & Plan:  Prediabetes Assessment & Plan: Pt advised of the following: Work on a diabetic diet, try to incorporate exercise at least 20-30 a day for 3 days a week or more.  Ordering A1c pending results  Orders: -     Hemoglobin A1c  Allergic conjunctivitis of right eye Assessment & Plan: Discussed Pataday over-the-counter drops and also starting Claritin.  Because of the acute sensitivity I am also going to prescribe In case there is a mild chance of corneal abrasion.  I did highly encourage patient to follow-up with her eye doctor however she states the co-pay is currently too high.  I did give her recommendations for dry eye lubrication drops that she can get over-the-counter as well.  Rx sent in for erythromycin 0.5% ophthalmic ointment  Orders: -     Olopatadine HCl; Place 1 drop into the right eye 2 (two) times daily.  Dispense: 5 mL; Refill: 12  Acquired hypothyroidism -     T3, free -     T4, free -     TSH  Tobacco abuse  Elevated lipids Assessment & Plan: Ordered lipid panel, pending results. Work on low cholesterol diet and exercise as tolerated   Orders: -     Lipid  panel  Low serum vitamin B12 Assessment & Plan: Repeating levels today pending results  Orders: -     Vitamin B12  Corneal irritation of right eye -     Erythromycin; Place 1 Application into the left eye 4 (four) times daily for 7  days.  Dispense: 28 g; Refill: 0     Return in about 6 months (around 08/29/2024) for f/u CPE.  Mort Sawyers, MSN, APRN, FNP-C [facility]

## 2024-02-27 NOTE — Assessment & Plan Note (Signed)
 Repeating levels today pending results

## 2024-02-27 NOTE — Assessment & Plan Note (Signed)
Pt advised of the following: Work on a diabetic diet, try to incorporate exercise at least 20-30 a day for 3 days a week or more.  Ordering A1c pending results. 

## 2024-02-27 NOTE — Assessment & Plan Note (Signed)
 Ordered lipid panel, pending results. Work on low cholesterol diet and exercise as tolerated

## 2024-02-28 ENCOUNTER — Other Ambulatory Visit: Payer: Self-pay | Admitting: Family

## 2024-02-28 ENCOUNTER — Encounter: Payer: Self-pay | Admitting: Family

## 2024-02-28 DIAGNOSIS — E039 Hypothyroidism, unspecified: Secondary | ICD-10-CM

## 2024-02-28 DIAGNOSIS — F411 Generalized anxiety disorder: Secondary | ICD-10-CM

## 2024-02-28 DIAGNOSIS — E785 Hyperlipidemia, unspecified: Secondary | ICD-10-CM

## 2024-02-28 MED ORDER — LEVOTHYROXINE SODIUM 112 MCG PO TABS: ORAL_TABLET | ORAL | 3 refills | Status: DC

## 2024-03-01 MED ORDER — PRAVASTATIN SODIUM 10 MG PO TABS: 10.0000 mg | ORAL_TABLET | Freq: Every day | ORAL | 3 refills | Status: DC

## 2024-03-09 NOTE — Addendum Note (Signed)
 Addended by: Mort Sawyers on: 03/09/2024 11:47 AM   Modules accepted: Orders

## 2024-04-10 ENCOUNTER — Other Ambulatory Visit (INDEPENDENT_AMBULATORY_CARE_PROVIDER_SITE_OTHER)

## 2024-04-10 DIAGNOSIS — E039 Hypothyroidism, unspecified: Secondary | ICD-10-CM | POA: Diagnosis not present

## 2024-04-10 DIAGNOSIS — E785 Hyperlipidemia, unspecified: Secondary | ICD-10-CM | POA: Diagnosis not present

## 2024-04-10 LAB — LIPID PANEL
Cholesterol: 218 mg/dL — ABNORMAL HIGH (ref 0–200)
HDL: 49.6 mg/dL (ref >39.00)
LDL Cholesterol: 152 mg/dL — ABNORMAL HIGH (ref 0–99)
NonHDL: 168.84
Total CHOL/HDL Ratio: 4
Triglycerides: 83 mg/dL (ref 0.0–149.0)
VLDL: 16.6 mg/dL (ref 0.0–40.0)

## 2024-04-10 LAB — TSH: TSH: 2.25 u[IU]/mL (ref 0.35–5.50)

## 2024-04-11 ENCOUNTER — Other Ambulatory Visit: Payer: Self-pay | Admitting: Family

## 2024-04-11 ENCOUNTER — Encounter: Payer: Self-pay | Admitting: Family

## 2024-04-11 DIAGNOSIS — E785 Hyperlipidemia, unspecified: Secondary | ICD-10-CM

## 2024-09-10 ENCOUNTER — Other Ambulatory Visit: Payer: Self-pay | Admitting: Family

## 2024-09-10 ENCOUNTER — Other Ambulatory Visit (INDEPENDENT_AMBULATORY_CARE_PROVIDER_SITE_OTHER)

## 2024-09-10 DIAGNOSIS — R739 Hyperglycemia, unspecified: Secondary | ICD-10-CM

## 2024-09-10 DIAGNOSIS — E538 Deficiency of other specified B group vitamins: Secondary | ICD-10-CM

## 2024-09-10 DIAGNOSIS — E785 Hyperlipidemia, unspecified: Secondary | ICD-10-CM

## 2024-09-10 DIAGNOSIS — E039 Hypothyroidism, unspecified: Secondary | ICD-10-CM

## 2024-09-10 LAB — LIPID PANEL
Cholesterol: 222 mg/dL — ABNORMAL HIGH (ref 0–200)
HDL: 57.6 mg/dL (ref >39.00)
LDL Cholesterol: 149 mg/dL — ABNORMAL HIGH (ref 0–99)
NonHDL: 164.26
Total CHOL/HDL Ratio: 4
Triglycerides: 76 mg/dL (ref 0.0–149.0)
VLDL: 15.2 mg/dL (ref 0.0–40.0)

## 2024-09-10 LAB — BASIC METABOLIC PANEL WITH GFR
BUN: 16 mg/dL (ref 6–23)
CO2: 26 meq/L (ref 19–32)
Calcium: 9.3 mg/dL (ref 8.4–10.5)
Chloride: 102 meq/L (ref 96–112)
Creatinine, Ser: 0.74 mg/dL (ref 0.40–1.20)
GFR: 89.47 mL/min (ref >60.00)
Glucose, Bld: 80 mg/dL (ref 70–99)
Potassium: 4 meq/L (ref 3.5–5.1)
Sodium: 137 meq/L (ref 135–145)

## 2024-09-10 LAB — HEMOGLOBIN A1C: Hgb A1c MFr Bld: 6.1 % (ref 4.6–6.5)

## 2024-09-10 LAB — TSH: TSH: 14.87 u[IU]/mL — ABNORMAL HIGH (ref 0.35–5.50)

## 2024-09-10 LAB — VITAMIN B12: Vitamin B-12: 276 pg/mL (ref 211–911)

## 2024-09-11 ENCOUNTER — Ambulatory Visit: Payer: Self-pay | Admitting: Family

## 2024-09-11 DIAGNOSIS — E039 Hypothyroidism, unspecified: Secondary | ICD-10-CM

## 2024-09-11 MED ORDER — LEVOTHYROXINE SODIUM 112 MCG PO TABS
112.0000 ug | ORAL_TABLET | Freq: Every day | ORAL | 3 refills | Status: DC
Start: 2024-09-11 — End: 2024-09-28

## 2024-09-12 ENCOUNTER — Ambulatory Visit (INDEPENDENT_AMBULATORY_CARE_PROVIDER_SITE_OTHER)

## 2024-09-12 DIAGNOSIS — E538 Deficiency of other specified B group vitamins: Secondary | ICD-10-CM | POA: Diagnosis not present

## 2024-09-12 MED ORDER — CYANOCOBALAMIN 1000 MCG/ML IJ SOLN
1000.0000 ug | Freq: Once | INTRAMUSCULAR | Status: AC
  Administered 2024-09-12: 1000 ug via INTRAMUSCULAR

## 2024-09-12 NOTE — Progress Notes (Signed)
Per orders of Mort Sawyers, NP, injection of vitamin b 12  given by Lewanda Rife in left deltoid. Patient tolerated injection well. Patient will make appointment for 1 month.

## 2024-09-26 ENCOUNTER — Telehealth: Payer: Self-pay | Admitting: Family

## 2024-09-26 DIAGNOSIS — Z1231 Encounter for screening mammogram for malignant neoplasm of breast: Secondary | ICD-10-CM

## 2024-09-26 NOTE — Telephone Encounter (Signed)
 Copied from CRM [id number]. Topic: Referral - Request for Referral >> Sep 26, 2024  7:56 AM Donna BRAVO wrote: Did the patient discuss referral with their provider in the last year? Yes Appointment offered? No  Type of order/referral and detailed reason for visit: Mammogram   Preference of office, provider, location: [facility] on [address]  If referral order, have you been seen by this specialty before? Yes (If Yes, this issue or another issue? When? Where?  Can we respond through MyChart? No

## 2024-09-26 NOTE — Telephone Encounter (Unsigned)
 Copied from CRM #[id number]. Topic: Referral - Request for Referral >> Sep 26, 2024  2:28 PM Rea C wrote: Did the patient discuss referral with their provider in the last year? Yes (If No - schedule appointment) (If Yes - send message)  Appointment offered? Yes  Type of order/referral and detailed reason for visit: Referral needed for diagnostic ultrasound on breasts from [facility]. They won't do a mammogram because patient is having pain. So, they want to do a diagnostic ultrasound and patient needs a referral.  Preference of office, provider, location: [facility]   If referral order, have you been seen by this specialty before? Yes  (If Yes, this issue or another issue? When? Where?  Can we respond through MyChart? Yes or phone call

## 2024-09-26 NOTE — Telephone Encounter (Signed)
 Spoke with pt and advised her that she would need an OV before a diagnostic mammogram could be ordered. OV has been scheduled for 09/28/24 at 0900.

## 2024-09-26 NOTE — Telephone Encounter (Signed)
 Order for mammogram has been placed. Pt is aware that she can call and schedule this for herself.

## 2024-09-28 ENCOUNTER — Ambulatory Visit: Admitting: Family

## 2024-09-28 VITALS — BP 122/72 | HR 91 | Temp 98.3°F | Ht 68.0 in | Wt 152.6 lb

## 2024-09-28 DIAGNOSIS — E039 Hypothyroidism, unspecified: Secondary | ICD-10-CM | POA: Diagnosis not present

## 2024-09-28 DIAGNOSIS — R9230 Dense breasts, unspecified: Secondary | ICD-10-CM

## 2024-09-28 DIAGNOSIS — N6315 Unspecified lump in the right breast, overlapping quadrants: Secondary | ICD-10-CM

## 2024-09-28 MED ORDER — SYNTHROID 112 MCG PO TABS: ORAL_TABLET | ORAL | 0 refills | Status: DC

## 2024-09-28 MED ORDER — SYNTHROID 112 MCG PO TABS: ORAL_TABLET | ORAL | 1 refills | Status: DC

## 2024-09-28 NOTE — Progress Notes (Signed)
 Established Patient Office Visit  Subjective:      CC:  Chief Complaint  Patient presents with   Acute Visit    R breast pain x2 weeks    HPI: Brenda Johnston is a 58 y.o. female presenting on 09/28/2024 for Acute Visit (R breast pain x2 weeks) .  Discussed the use of AI scribe software for clinical note transcription with the patient, who gave verbal consent to proceed.  History of Present Illness Brenda Johnston is a 58 year old female who presents with right breast pain.  She has been experiencing pain in her right breast, particularly in the outer breast and underarm area, for the past few weeks. The pain is present both at rest and with movement, and is more pronounced when lying down at night. No recent injury, nipple discharge, or rash. She sometimes feels a mass in the breast but is unsure if it is a vein or muscle. Her last mammogram was in 2019.  She has a history of thyroid  issues and is currently taking levothyroxine  at a dose of 112 mcg. She feels jittery, shaky, and experiences a pounding heart, which she associates with her medication. She has been taking the medication after meals, which she believes affects its efficacy. She also reports feeling 'wired', having difficulty sleeping, dry skin, and thinning hair. She takes the medication daily but previously took it six days a week with a break on the seventh day. She has also been receiving B12 shots and taking oral B12, which she suspects may contribute to her symptoms.  She mentions having scoliosis, which affects her ability to lie flat comfortably.         Social history:  Relevant past medical, surgical, family and social history reviewed and updated as indicated. Interim medical history since our last visit reviewed.  Allergies and medications reviewed and updated.  DATA REVIEWED: CHART IN EPIC     ROS: Negative unless specifically indicated above in HPI.    Current Outpatient Medications:    albuterol   (VENTOLIN  HFA) 108 (90 Base) MCG/ACT inhaler, Inhale 2 puffs into the lungs every 4 (four) hours as needed for wheezing or shortness of breath., Disp: 8 g, Rfl: 0   alfuzosin  (UROXATRAL ) 10 MG 24 hr tablet, Take 1 tablet (10 mg total) by mouth daily with breakfast., Disp: 20 tablet, Rfl: 0   ALPRAZolam  (XANAX ) 0.25 MG tablet, Take 1 tablet (0.25 mg total) by mouth 2 (two) times daily as needed for anxiety., Disp: 20 tablet, Rfl: 0   busPIRone  (BUSPAR ) 7.5 MG tablet, TAKE 1 TABLET BY MOUTH 2 TIMES DAILY., Disp: 180 tablet, Rfl: 1   cyclobenzaprine  (FLEXERIL ) 10 MG tablet, TAKE 1 TABLET BY MOUTH THREE TIMES A DAY AS NEEDED FOR MUSCLE SPASMS, Disp: 20 tablet, Rfl: 0   olopatadine  (PATADAY ) 0.1 % ophthalmic solution, Place 1 drop into the right eye 2 (two) times daily., Disp: 5 mL, Rfl: 12   SYNTHROID  112 MCG tablet, Take one po every day for six days off the 7th day, Disp: 90 tablet, Rfl: 0        Objective:        BP 122/72 (BP Location: Left Arm, Patient Position: Sitting, Cuff Size: Normal)   Pulse 91   Temp 98.3 F (36.8 C) (Temporal)   Ht 5' 8 (1.727 m)   Wt 152 lb 9.6 oz (69.2 kg)   SpO2 98%   BMI 23.20 kg/m   Physical Exam BREAST: Dense breasts with multiple palpable lumps.  Tenderness at right outer breast, possible cyst. Possible basal cell on breast.  Wt Readings from Last 3 Encounters:  09/28/24 152 lb 9.6 oz (69.2 kg)  02/27/24 151 lb 6.4 oz (68.7 kg)  12/26/23 155 lb (70.3 kg)    Physical Exam Vitals reviewed.  Chest:  Breasts:    Right: Mass (densities as well as tender 2.5 cm diameter mass 9 o clock right outer) and tenderness present. No inverted nipple, nipple discharge or skin change.  Lymphadenopathy:     Upper Body:     Right upper body: No axillary adenopathy.     Left upper body: No axillary adenopathy.          Results   Assessment & Plan:   Assessment and Plan Assessment & Plan Right breast pain with palpable density Right breast pain  with palpable density at the outer right breast, possibly a cyst. No nipple discharge or rash. Dense breast tissue present. Differential includes cyst or mass. Insurance issues previously delayed diagnostic imaging. - Order right breast ultrasound and diagnostic mamm[facility] [place] - [employer] coverage for ultrasound due to indications - Discuss potential for cyst drainage if inflamed - Rule out mass  Acquired hypothyroidism Acquired hypothyroidism with symptoms of jitteriness, heart palpitations, and insomnia. Current treatment with levothyroxine  112 mcg daily, but taken incorrectly with food. Symptoms suggest possible hyperthyroid state due to incorrect administration. Recent lab results indicate hypothyroid state due to improper medication intake. Brand name Synthroid  may provide more consistent dosing. - Switch to brand name Synthroid  112 mcg, 6 days a week, off on the 7th day - Repeat TSH in 4 weeks - Stop taking oral B12 to assess if it contributes to symptoms - Monitor for symptoms of hyperthyroidism and adjust dosage if necessary - Schedule lab-only follow-up in 4 weeks  Low serum vitamin B12 Low serum vitamin B12 previously managed with B12 injections and oral supplementation. Oral B12 may contribute to symptoms of feeling jittery and on 'speed'. - Discontinue oral B12 supplementation - Monitor for changes in symptoms after stopping oral B12  ]      Return in about 6 months (around 03/29/2025) for f/u CPE.     Ginger Patrick, MSN, APRN, FNP-C [facility]

## 2024-09-28 NOTE — Patient Instructions (Signed)
  I have sent an electronic order over to your preferred location for the following:   []   Bilateral diagnostic mammogram and right breast ultrasound   Please give this center a call to get scheduled at your convenience.   [x]   [facility]      [address] [place], [place]        [phone]         Make sure to wear two piece  clothing  No lotions powders or deodorants the day of the appointment Make sure to bring picture ID and insurance card.  Bring list of medications you are currently taking including any supplements.

## 2024-10-02 ENCOUNTER — Ambulatory Visit: Admitting: Family

## 2024-10-03 ENCOUNTER — Encounter

## 2024-10-03 ENCOUNTER — Ambulatory Visit: Payer: Self-pay | Admitting: Family

## 2024-10-03 ENCOUNTER — Ambulatory Visit
Admission: RE | Admit: 2024-10-03 | Discharge: 2024-10-03 | Disposition: A | Discharge status: Home / Self Care | Source: Ambulatory Visit | Attending: Family | Admitting: Family

## 2024-10-03 ENCOUNTER — Ambulatory Visit
Admission: RE | Admit: 2024-10-03 | Discharge: 2024-10-03 | Disposition: A | Source: Ambulatory Visit | Attending: Family

## 2024-10-03 ENCOUNTER — Other Ambulatory Visit

## 2024-10-03 DIAGNOSIS — N6315 Unspecified lump in the right breast, overlapping quadrants: Secondary | ICD-10-CM

## 2024-10-03 DIAGNOSIS — R9230 Dense breasts, unspecified: Secondary | ICD-10-CM

## 2024-10-03 DIAGNOSIS — N644 Mastodynia: Secondary | ICD-10-CM | POA: Diagnosis not present

## 2024-10-03 DIAGNOSIS — R928 Other abnormal and inconclusive findings on diagnostic imaging of breast: Secondary | ICD-10-CM | POA: Diagnosis not present

## 2024-10-10 ENCOUNTER — Other Ambulatory Visit

## 2024-10-10 ENCOUNTER — Encounter

## 2024-10-16 ENCOUNTER — Ambulatory Visit

## 2024-10-18 ENCOUNTER — Ambulatory Visit

## 2024-10-18 DIAGNOSIS — E538 Deficiency of other specified B group vitamins: Secondary | ICD-10-CM

## 2024-10-18 MED ORDER — CYANOCOBALAMIN 1000 MCG/ML IJ SOLN
1000.0000 ug | Freq: Once | INTRAMUSCULAR | Status: AC
  Administered 2024-10-18: 1000 ug via INTRAMUSCULAR

## 2024-10-18 NOTE — Progress Notes (Signed)
 Per orders of Ginger Patrick, NP, injection of B-12 given by Harlene KATHEE Arenas in right deltoid. Patient tolerated injection well. Patient will make appointment for 1 month.

## 2024-11-20 ENCOUNTER — Ambulatory Visit (INDEPENDENT_AMBULATORY_CARE_PROVIDER_SITE_OTHER)

## 2024-11-20 DIAGNOSIS — E538 Deficiency of other specified B group vitamins: Secondary | ICD-10-CM

## 2024-11-20 MED ORDER — CYANOCOBALAMIN 1000 MCG/ML IJ SOLN
1000.0000 ug | Freq: Once | INTRAMUSCULAR | Status: AC
  Administered 2024-11-20: 1000 ug via INTRAMUSCULAR

## 2024-11-20 NOTE — Progress Notes (Signed)
 Per orders of Brenda Patrick, NP, injection of vitamin b 12 inj given by [name] in left deltoid. Patient tolerated injection well.

## 2024-12-06 ENCOUNTER — Other Ambulatory Visit: Payer: Self-pay | Admitting: Family

## 2024-12-06 DIAGNOSIS — E039 Hypothyroidism, unspecified: Secondary | ICD-10-CM

## 2024-12-12 ENCOUNTER — Encounter: Payer: Self-pay | Admitting: Family

## 2024-12-12 ENCOUNTER — Ambulatory Visit: Admitting: Family

## 2024-12-12 VITALS — BP 128/86 | HR 77 | Temp 98.1°F | Ht 68.0 in | Wt 158.2 lb

## 2024-12-12 DIAGNOSIS — E039 Hypothyroidism, unspecified: Secondary | ICD-10-CM

## 2024-12-12 DIAGNOSIS — E538 Deficiency of other specified B group vitamins: Secondary | ICD-10-CM

## 2024-12-12 LAB — B12 AND FOLATE PANEL
Folate: 15.2 ng/mL (ref >5.9)
Vitamin B-12: 497 pg/mL (ref 211–911)

## 2024-12-12 LAB — TSH: TSH: 2.94 u[IU]/mL (ref 0.35–5.50)

## 2024-12-12 LAB — T4, FREE: Free T4: 1.07 ng/dL (ref 0.60–1.60)

## 2024-12-12 NOTE — Progress Notes (Signed)
 "  Established Patient Office Visit  Subjective:      CC:  Chief Complaint  Patient presents with   Follow-up    HPI: Brenda Johnston is a 58 y.o. female presenting on 12/12/2024 for Follow-up .  Discussed the use of AI scribe software for clinical note transcription with the patient, who gave verbal consent to proceed.  History of Present Illness A 58 year old female with hypothyroidism and vitamin B12 deficiency presents for follow-up of her thyroid  and B12 levels.  She feels better overall, attributing improvements to her current B12 injections. Initially, she experienced fatigue and difficulty keeping her eyes open, but now feels more energetic. She has been receiving B12 injections, with her last injection three weeks ago, which was her third. She previously tried B12 pills but they did not agree with her.  Regarding her thyroid  condition, she was previously on levothyroxine  112 mcg daily but experienced jitteriness and shakiness. She switched to taking Synthroid  but did not receive it, so she continued with levothyroxine . She initially adjusted her dosing schedule to every six days off the seventh but has since returned to daily dosing. She has also adjusted her routine to take the medication early in the morning without food or drink for at least 30 minutes, which has improved her symptoms. She reports feeling better with this regimen.  She mentions a history of breast pain, which was evaluated with a mammogram on October 8th. 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  She notes a change in her weight, having gained some weight after previously feeling she could eat without feeling full. This has improved somewhat, and she feels her weight is more stable now.         Social history:  Relevant past medical, surgical, family and social history reviewed and updated as indicated.  Interim medical history since our last visit reviewed.  Allergies and medications reviewed and updated.  DATA REVIEWED: CHART IN EPIC     ROS: Negative unless specifically indicated above in HPI.   Current Medications[1]        Objective:        BP 128/86 (BP Location: Left Arm, Patient Position: Sitting, Cuff Size: Normal)   Pulse 77   Temp 98.1 F (36.7 C) (Temporal)   Ht 5' 8 (1.727 m)   Wt 158 lb 3.2 oz (71.8 kg)   SpO2 95%   BMI 24.05 kg/m   Physical Exam   Wt Readings from Last 3 Encounters:  12/12/24 158 lb 3.2 oz (71.8 kg)  09/28/24 152 lb 9.6 oz (69.2 kg)  02/27/24 151 lb 6.4 oz (68.7 kg)    Physical Exam Constitutional:      General: She is not in acute distress.    Appearance: Normal appearance. She is normal weight. She is not ill-appearing, toxic-appearing or diaphoretic.  HENT:     Head: Normocephalic.  Cardiovascular:     Rate and Rhythm: Normal rate and regular rhythm.  Pulmonary:     Effort: Pulmonary effort is normal.     Breath sounds: Normal breath sounds.  Musculoskeletal:        General: Normal range of motion.  Neurological:     General: No focal deficit present.     Mental Status: She is alert and oriented to person, place, and time. Mental status is at baseline.  Psychiatric:  Mood and Affect: Mood normal.        Behavior: Behavior normal.        Thought Content: Thought content normal.        Judgment: Judgment normal.          Results Labs Vitamin B12: 276 TSH: in the 14s  Radiology Mammogram (10/03/2024): Normal  Assessment & Plan:   Assessment and Plan Assessment & Plan Acquired hypothyroidism Reports improvement in symptoms after adjusting Synthroid  intake to daily dosing and taking it on an empty stomach. Previous symptoms of jitteriness and shakiness have resolved. Current TSH goal is between 0.5 and 2.0 for optimal symptom control. - Continue Synthroid  112 mcg daily. - Ordered thyroid  function  tests to assess current TSH levels.  Vitamin B12 deficiency Reports significant improvement in fatigue and energy levels after starting B12 injections. Previous oral B12 supplementation was not well tolerated. Current B12 level was 276, and she received her third injection three weeks ago. Discussed potential for sublingual or liquid B12 as alternatives if needed. - Ordered repeat B12 level to assess current status. - Continue B12 injections as needed based on lab results.  General Health Maintenance Recent mammogram on October 8th was normal.       Return in about 6 months (around 06/12/2025) for f/u CPE.     Ginger Patrick, MSN, APRN, FNP-C [facility]        [1]  Current Outpatient Medications:    albuterol  (VENTOLIN  HFA) 108 (90 Base) MCG/ACT inhaler, Inhale 2 puffs into the lungs every 4 (four) hours as needed for wheezing or shortness of breath., Disp: 8 g, Rfl: 0   alfuzosin  (UROXATRAL ) 10 MG 24 hr tablet, Take 1 tablet (10 mg total) by mouth daily with breakfast., Disp: 20 tablet, Rfl: 0   ALPRAZolam  (XANAX ) 0.25 MG tablet, Take 1 tablet (0.25 mg total) by mouth 2 (two) times daily as needed for anxiety., Disp: 20 tablet, Rfl: 0   busPIRone  (BUSPAR ) 7.5 MG tablet, TAKE 1 TABLET BY MOUTH 2 TIMES DAILY., Disp: 180 tablet, Rfl: 1   cyclobenzaprine  (FLEXERIL ) 10 MG tablet, TAKE 1 TABLET BY MOUTH THREE TIMES A DAY AS NEEDED FOR MUSCLE SPASMS, Disp: 20 tablet, Rfl: 0   olopatadine  (PATADAY ) 0.1 % ophthalmic solution, Place 1 drop into the right eye 2 (two) times daily., Disp: 5 mL, Rfl: 12   SYNTHROID  112 MCG tablet, TAKE ONE BY MOUTH EVERY DAY FOR SIX DAYS OFF THE 7TH DAY, Disp: 90 tablet, Rfl: 0  "

## 2024-12-13 ENCOUNTER — Ambulatory Visit: Payer: Self-pay | Admitting: Family
